# Patient Record
Sex: Male | Born: 1965 | Marital: Married
Health system: Southern US, Community
[De-identification: ages and names within clinical notes are randomized; demographics above are authoritative.]

## PROBLEM LIST (undated history)

## (undated) DIAGNOSIS — I2699 Other pulmonary embolism without acute cor pulmonale: Secondary | ICD-10-CM

## (undated) DIAGNOSIS — F329 Major depressive disorder, single episode, unspecified: Secondary | ICD-10-CM

## (undated) DIAGNOSIS — J449 Chronic obstructive pulmonary disease, unspecified: Secondary | ICD-10-CM

## (undated) DIAGNOSIS — F419 Anxiety disorder, unspecified: Secondary | ICD-10-CM

## (undated) DIAGNOSIS — M5126 Other intervertebral disc displacement, lumbar region: Secondary | ICD-10-CM

## (undated) DIAGNOSIS — K219 Gastro-esophageal reflux disease without esophagitis: Secondary | ICD-10-CM

## (undated) DIAGNOSIS — J45909 Unspecified asthma, uncomplicated: Secondary | ICD-10-CM

## (undated) DIAGNOSIS — F32A Depression, unspecified: Secondary | ICD-10-CM

## (undated) DIAGNOSIS — M797 Fibromyalgia: Secondary | ICD-10-CM

## (undated) DIAGNOSIS — I10 Essential (primary) hypertension: Secondary | ICD-10-CM

## (undated) DIAGNOSIS — E7800 Pure hypercholesterolemia, unspecified: Secondary | ICD-10-CM

## (undated) DIAGNOSIS — I82409 Acute embolism and thrombosis of unspecified deep veins of unspecified lower extremity: Secondary | ICD-10-CM

## (undated) DIAGNOSIS — Z8719 Personal history of other diseases of the digestive system: Secondary | ICD-10-CM

## (undated) HISTORY — DX: Other pulmonary embolism without acute cor pulmonale: I26.99

## (undated) HISTORY — DX: Gastro-esophageal reflux disease without esophagitis: K21.9

## (undated) HISTORY — DX: Unspecified asthma, uncomplicated: J45.909

---

## 2015-07-10 ENCOUNTER — Ambulatory Visit (HOSPITAL_BASED_OUTPATIENT_CLINIC_OR_DEPARTMENT_OTHER): Payer: Medicaid Other | Attending: Allergy and Immunology

## 2015-07-10 VITALS — Ht 75.0 in | Wt 260.0 lb

## 2015-07-10 DIAGNOSIS — G4733 Obstructive sleep apnea (adult) (pediatric): Secondary | ICD-10-CM | POA: Diagnosis present

## 2015-07-10 DIAGNOSIS — R0683 Snoring: Secondary | ICD-10-CM | POA: Insufficient documentation

## 2015-07-10 DIAGNOSIS — G4736 Sleep related hypoventilation in conditions classified elsewhere: Secondary | ICD-10-CM | POA: Diagnosis not present

## 2015-07-12 ENCOUNTER — Ambulatory Visit (HOSPITAL_BASED_OUTPATIENT_CLINIC_OR_DEPARTMENT_OTHER): Payer: Medicaid Other | Admitting: Internal Medicine

## 2015-07-12 DIAGNOSIS — G4733 Obstructive sleep apnea (adult) (pediatric): Secondary | ICD-10-CM | POA: Diagnosis not present

## 2015-07-12 NOTE — Progress Notes (Signed)
    Patient Name: Taylor Wise, Taylor Wise Date: 07/10/2015 Gender: Male D.O.B: 04-27-1966 Age (years): 49 Referring Provider: Laurette Schimke Height (inches): 75 Interpreting Physician: Jetty Duhamel MD, ABSM Weight (lbs): 262 RPSGT: Wylie Hail BMI: 33 MRN: [id number] Neck Size: 16.50 CLINICAL INFORMATION Sleep Study Type: NPSG  Indication for sleep study: COPD, OSA  Epworth Sleepiness Score:  SLEEP STUDY TECHNIQUE As per the AASM Manual for the Scoring of Sleep and Associated Events v2.3 (April 2016) with a hypopnea requiring 4% desaturations.  The channels recorded and monitored were frontal, central and occipital EEG, electrooculogram (EOG), submentalis EMG (chin), nasal and oral airflow, thoracic and abdominal wall motion, anterior tibialis EMG, snore microphone, electrocardiogram, and pulse oximetry.  MEDICATIONS Patient's medications include: Charted for review. Medications self-administered by patient during sleep study : No sleep medicine administered.  SLEEP ARCHITECTURE The study was initiated at 11:03:38 PM and ended at 5:06:41 AM.  Sleep onset time was 7.5 minutes and the sleep efficiency was 89.0%. The total sleep time was 323.1 minutes.  Stage REM latency was 121.5 minutes.  The patient spent 9.60% of the night in stage N1 sleep, 77.25% in stage N2 sleep, 0.00% in stage N3 and 13.16% in REM.  Alpha intrusion was absent.  Supine sleep was 71.71%.  Awake after sleep onset 32.5 minutes  RESPIRATORY PARAMETERS The overall apnea/hypopnea index (AHI) was 18.0 per hour. There were 1 total apneas, including 0 obstructive, 0 central and 1 mixed apneas. There were 96 hypopneas and 1 RERAs.  There were insufficient early events to meet protocol requirement for split CPAP titration b protocol on this study night.  The AHI during Stage REM sleep was 22.6 per hour.  AHI while supine was 21.8 per hour.  The mean oxygen saturation was 90.96%. The minimum SpO2 during sleep  was 82.00%.  Loud snoring was noted during this study.  CARDIAC DATA The 2 lead EKG demonstrated sinus rhythm. The mean heart rate was 61.16 beats per minute. Other EKG findings include: None.  LEG MOVEMENT DATA The total PLMS were 0 with a resulting PLMS index of 0.00. Associated arousal with leg movement index was 0.0 .  IMPRESSIONS Moderate obstructive sleep apnea occurred during this study (AHI = 18.0/h). There were insufficient early events to meet protocol requirement for split CPAP titration on this study night. No significant central sleep apnea occurred during this study (CAI = 0.0/h). Mild oxygen desaturation was noted during this study (Min O2 = 82.00%).   20.3 minutes recorded during sleep with room air O2 sat <= 88% The patient snored with Loud snoring volume. No cardiac abnormalities were noted during this study. Clinically significant periodic limb movements did not occur during sleep. No significant associated arousals.   DIAGNOSIS Obstructive Sleep Apnea (327.23 [G47.33 ICD-10]) Nocturnal Hypoxemia (327.26 [G47.36 ICD-10])  RECOMMENDATIONS Therapeutic CPAP titration to determine optimal pressure required to alleviate sleep disordered breathing. Positional therapy avoiding supine position during sleep. Avoid alcohol, sedatives and other CNS depressants that may worsen sleep apnea and disrupt normal sleep architecture. Sleep hygiene should be reviewed to assess factors that may improve sleep quality. Weight management and regular exercise should be initiated or continued if appropriate.  Waymon Budge Diplomate, American Board of Sleep Medicine  ELECTRONICALLY SIGNED ON:  07/12/2015, 1:34 PM [facility] PH: [phone]   FX: [phone] ACCREDITED BY THE AMERICAN ACADEMY OF SLEEP MEDICINE

## 2015-08-06 DIAGNOSIS — J309 Allergic rhinitis, unspecified: Secondary | ICD-10-CM

## 2015-08-06 DIAGNOSIS — J4550 Severe persistent asthma, uncomplicated: Secondary | ICD-10-CM | POA: Insufficient documentation

## 2015-08-06 DIAGNOSIS — I10 Essential (primary) hypertension: Secondary | ICD-10-CM | POA: Insufficient documentation

## 2015-08-06 DIAGNOSIS — K219 Gastro-esophageal reflux disease without esophagitis: Secondary | ICD-10-CM | POA: Insufficient documentation

## 2015-08-06 DIAGNOSIS — H1010 Acute atopic conjunctivitis, unspecified eye: Secondary | ICD-10-CM | POA: Insufficient documentation

## 2015-08-06 DIAGNOSIS — Z92241 Personal history of systemic steroid therapy: Secondary | ICD-10-CM | POA: Insufficient documentation

## 2015-08-14 ENCOUNTER — Ambulatory Visit (HOSPITAL_BASED_OUTPATIENT_CLINIC_OR_DEPARTMENT_OTHER): Payer: Medicaid Other | Attending: Allergy and Immunology

## 2015-08-14 ENCOUNTER — Other Ambulatory Visit: Payer: Self-pay

## 2015-08-14 DIAGNOSIS — G4730 Sleep apnea, unspecified: Secondary | ICD-10-CM | POA: Diagnosis present

## 2015-08-14 DIAGNOSIS — G4733 Obstructive sleep apnea (adult) (pediatric): Secondary | ICD-10-CM | POA: Diagnosis not present

## 2015-08-14 MED ORDER — MEPOLIZUMAB 100 MG ~~LOC~~ SOLR
100.0000 mg | SUBCUTANEOUS | Status: AC
  Administered 2015-09-09 – 2016-11-08 (×8): 100 mg via SUBCUTANEOUS

## 2015-08-16 ENCOUNTER — Encounter (HOSPITAL_BASED_OUTPATIENT_CLINIC_OR_DEPARTMENT_OTHER): Payer: Medicaid Other | Admitting: Internal Medicine

## 2015-08-16 DIAGNOSIS — G4730 Sleep apnea, unspecified: Secondary | ICD-10-CM | POA: Diagnosis not present

## 2015-08-16 NOTE — Progress Notes (Signed)
  Patient Name: Taylor Wise, Taylor Wise Date: 08/14/2015 Gender: Male D.O.B: [birth year] Age (years): 49 Referring Provider: Laurette Schimke Height (inches): 75 Interpreting Physician: Jetty Duhamel MD, ABSM Weight (lbs): 262 RPSGT: Cherylann Parr BMI: 33 MRN: [id number] Neck Size: 16.50 CLINICAL INFORMATION The patient is referred for a CPAP titration to treat sleep apnea.     Date of  diagnostic NPSG, Split Night or HST:   NPSG  07/10/15, AHI 18/ hr,  body weight 262 lbs  SLEEP STUDY TECHNIQUE As per the AASM Manual for the Scoring of Sleep and Associated Events v2.3 (April 2016) with a hypopnea requiring 4% desaturations.  The channels recorded and monitored were frontal, central and occipital EEG, electrooculogram (EOG), submentalis EMG (chin), nasal and oral airflow, thoracic and abdominal wall motion, anterior tibialis EMG, snore microphone, electrocardiogram, and pulse oximetry. Continuous positive airway pressure (CPAP) was initiated at the beginning of the study and titrated to treat sleep-disordered breathing.  MEDICATIONS Medications taken by the patient : charted for review Medications administered by patient during sleep study : No sleep medicine administered.  TECHNICIAN COMMENTS Comments added by technician: Patient talked in his/her sleep.  Comments added by scorer: N/A  RESPIRATORY PARAMETERS Optimal PAP Pressure (cm): 10 AHI at Optimal Pressure (/hr): 10.3 Overall Minimal O2 (%): 86.00 Supine % at Optimal Pressure (%): 100 Minimal O2 at Optimal Pressure (%): 89.0    SLEEP ARCHITECTURE The study was initiated at 9:58:20 PM and ended at 4:51:40 AM.  Sleep onset time was 2.2 minutes and the sleep efficiency was 93.1%. The total sleep time was 384.6 minutes.  The patient spent 3.77% of the night in stage N1 sleep, 52.78% in stage N2 sleep, 14.04% in stage N3 and 29.41% in REM.Stage REM latency was 143.0 minutes  Wake after sleep onset was 26.5. Alpha intrusion was  absent. Supine sleep was 81.89%.  CARDIAC DATA The 2 lead EKG demonstrated sinus rhythm. The mean heart rate was 66.19 beats per minute. Other EKG findings include: None.  LEG MOVEMENT DATA The total Periodic Limb Movements of Sleep (PLMS) were 391. The PLMS index was 60.99. A PLMS index of <15 is considered normal in adults.  IMPRESSIONS Titration stopped at 10 cwp, with residual AHI 10.3/ hr. Suggest initial home trial at 12 cwp, or auto-titrate 5-15 cwp. Central sleep apnea was not noted during this titration (CAI = 2.3/h). Moderate oxygen desaturations were observed during this titration (min O2 = 86.00%). No snoring was audible during this study. No cardiac abnormalities were observed during this study. Severe periodic limb movements were observed during this study. Arousals associated with PLMs were rare.  DIAGNOSIS Obstructive Sleep Apnea (327.23 [G47.33 ICD-10])  RECOMMENDATIONS Trial of CPAP therapy on 12 cm H2O, or auto-titrate 5-15 cwp with a Large size Fisher[employer]Paykel Full Face Mask Simplus mask and heated humidification. Avoid alcohol, sedatives and other CNS depressants that may worsen sleep apnea and disrupt normal sleep architecture. Sleep hygiene should be reviewed to assess factors that may improve sleep quality. Weight management and regular exercise should be initiated or continued.    Waymon Budge Diplomate, American Board of Sleep Medicine  ELECTRONICALLY SIGNED ON:  08/16/2015, 10:17 AM [facility] PH: [phone]   FX: [phone] ACCREDITED BY THE AMERICAN ACADEMY OF SLEEP MEDICINE

## 2015-09-09 ENCOUNTER — Ambulatory Visit (INDEPENDENT_AMBULATORY_CARE_PROVIDER_SITE_OTHER): Payer: Medicaid Other | Admitting: *Deleted

## 2015-09-09 DIAGNOSIS — J4550 Severe persistent asthma, uncomplicated: Secondary | ICD-10-CM | POA: Diagnosis not present

## 2015-10-01 ENCOUNTER — Telehealth: Payer: Self-pay | Admitting: Allergy and Immunology

## 2015-10-01 NOTE — Telephone Encounter (Signed)
Pt is still waiting on a referral to a pulmonologist. States this was suppose to be ordered back in August.

## 2015-10-01 NOTE — Telephone Encounter (Signed)
Left message for pt to call back  °

## 2015-10-01 NOTE — Telephone Encounter (Signed)
Spoke to [place] he is going to call his pcp and have them refer him.

## 2015-10-01 NOTE — Telephone Encounter (Signed)
Please apologize to patient as we have no record of referring him to a pulmonologist in his chart. It have been primary doctor that was to refer him. We can refer; would it be OK to drive to [place]? If so, [facility].

## 2015-10-01 NOTE — Telephone Encounter (Signed)
Please advise, no indications of referral in pts chart.

## 2015-10-08 ENCOUNTER — Ambulatory Visit (HOSPITAL_BASED_OUTPATIENT_CLINIC_OR_DEPARTMENT_OTHER): Payer: Medicaid Other

## 2015-10-14 ENCOUNTER — Ambulatory Visit (INDEPENDENT_AMBULATORY_CARE_PROVIDER_SITE_OTHER): Payer: Medicaid Other | Admitting: *Deleted

## 2015-10-14 DIAGNOSIS — J4550 Severe persistent asthma, uncomplicated: Secondary | ICD-10-CM

## 2015-10-16 ENCOUNTER — Ambulatory Visit (INDEPENDENT_AMBULATORY_CARE_PROVIDER_SITE_OTHER): Payer: Medicaid Other | Admitting: Internal Medicine

## 2015-10-16 ENCOUNTER — Encounter: Payer: Self-pay | Admitting: *Deleted

## 2015-10-16 VITALS — BP 142/88 | HR 84 | Ht 75.0 in | Wt 290.8 lb

## 2015-10-16 DIAGNOSIS — G4733 Obstructive sleep apnea (adult) (pediatric): Secondary | ICD-10-CM

## 2015-10-16 DIAGNOSIS — J45991 Cough variant asthma: Secondary | ICD-10-CM | POA: Diagnosis not present

## 2015-10-16 DIAGNOSIS — I272 Other secondary pulmonary hypertension: Secondary | ICD-10-CM

## 2015-10-16 DIAGNOSIS — J4550 Severe persistent asthma, uncomplicated: Secondary | ICD-10-CM

## 2015-10-16 NOTE — Progress Notes (Signed)
Subjective:     Patient ID: Taylor Wise, male   DOB: 22-Jul-1966,   MRN: [id number]  HPI  [age] yowm never smoker followed by Dr Lucie LeatherKozlow for asthma since 6th grade much better on symbicort / nucala but failed sleep study so referred to pulmonary clinic 10/16/2015 by Dr Jeanie Seweredding    10/16/2015 1st [place] [facility] office visit/ Kana Reimann   Chief Complaint  Patient presents with  . Pulmonary Consult    Referred by Dr. Gwendlyn DeutscherJohn Redding. Pt c/o long history of asthma, chronic bronchitis and multiple PE's. Pt has also had sleep study recently, with reported abnormal results. Pt c/o dizziness, SOB, wheeze and occasional cough. Pt has history of glass dust exposure for several years. Pt is on Nucala.   pt eval [facility] for recurrent  PE x 2 years and on coumadin ever since then with about 60 lb wt gain and short of breath mailbox and back and [place] stop [place] before goes up steps assoc with palpitations > eval by Dr Bing MatterKrasowski and told he had elevated R sided pressures and might need RHC Has cpap no 02 at present   No obvious day to day or daytime variability or assoc excess or purulent sputum  or cp or chest tightness,  or overt sinus or hb symptoms. No unusual exp hx or h/o childhood pna/ asthma or knowledge of premature birth.  Sleeping ok on cpap without nocturnal  or early am exacerbation  of respiratory  c/o's or need for noct saba. Also denies any obvious fluctuation of symptoms with weather or environmental changes or other aggravating or alleviating factors except as outlined above   Current Medications, Allergies, Complete Past Medical History, Past Surgical History, Family History, and Social History were reviewed in [facility] electronic [facility] record.  ROS  The following are not active complaints unless bolded sore throat, dysphagia, dental problems, itching, sneezing,  nasal congestion or excess/ purulent secretions, ear ache,   fever, chills, sweats, unintended wt loss, classically pleuritic  or exertional cp, hemoptysis,  orthopnea pnd or leg swelling, presyncope, palpitations, abdominal pain, anorexia, nausea, vomiting, diarrhea  or change in bowel or bladder habits, change in stools or urine, dysuria,hematuria,  rash, arthralgias, visual complaints, headache, numbness, weakness or ataxia or problems with walking or coordination,  change in mood/affect or memory.  Outpatient Encounter Prescriptions as of 10/16/2015  Medication Sig  . albuterol (PROVENTIL HFA) 108 (90 BASE) MCG/ACT inhaler Inhale 2 puffs into the lungs every 4 (four) hours as needed for wheezing or shortness of breath.  Marland Kitchen. aspirin 81 MG tablet Take 81 mg by mouth daily.  . budesonide-formoterol (SYMBICORT) 160-4.5 MCG/ACT inhaler Inhale 2 puffs into the lungs 2 (two) times daily.  . cetirizine (ZYRTEC) 10 MG tablet Take 10 mg by mouth daily as needed for allergies.  Marland Kitchen. EPINEPHrine (EPIPEN 2-PAK) 0.3 mg/0.3 mL IJ SOAJ injection Inject 0.3 mg into the muscle once.  . mometasone (NASONEX) 50 MCG/ACT nasal spray Place 2 sprays into the nose daily.  . Multiple Vitamin (MULTIVITAMIN) tablet Take 1 tablet by mouth daily.  Marland Kitchen. omeprazole (PRILOSEC) 40 MG capsule Take 40 mg by mouth daily.  . ranitidine (ZANTAC) 300 MG tablet Take 300 mg by mouth at bedtime.  . WARFARIN SODIUM PO Take by mouth.  . Venlafaxine HCl (EFFEXOR PO) Take by mouth.   Facility-Administered Encounter Medications as of 10/16/2015  Medication  . Mepolizumab SOLR 100 mg            Review of  Systems     Objective:   Physical Exam    amb mod obese wm nad   Wt Readings from Last 3 Encounters:  10/16/15 290 lb 12.8 oz (131.906 kg)  07/10/15 260 lb (117.935 kg)    Vital signs reviewed  HEENT: nl dentition, turbinates, and oropharynx. Nl external ear canals without cough reflex   NECK :  without JVD/Nodes/TM/ nl carotid upstrokes bilaterally   LUNGS: no acc muscle use, clear to A and P bilaterally without cough on insp or exp  maneuvers   CV:  RRR  no s3 or murmur - Pos  increase in P2, no edema   ABD:  soft and nontender with nl excursion in the supine position. No bruits or organomegaly, bowel sounds nl  MS:  warm without deformities, calf tenderness, cyanosis or clubbing  SKIN: warm and dry without lesions    NEURO:  alert, approp, no deficits            Assessment:

## 2015-10-16 NOTE — Patient Instructions (Signed)
Try symbicort 80 Take 2 puffs first thing in am and then another 2 puffs about 12 hours later.   If cough less on the symbicort 80 then refill it, otherwise resume the 160   Please see patient coordinator before you leave today  to schedule overnight oxygen study on CPAP and we will call with results  If your have pulmonary hypertension based on your cardiac evaluation I would recommend [facility] and will let him know to consider referring you as it is probably from your blood clots

## 2015-10-17 ENCOUNTER — Encounter: Payer: Self-pay | Admitting: Internal Medicine

## 2015-10-17 DIAGNOSIS — G4733 Obstructive sleep apnea (adult) (pediatric): Secondary | ICD-10-CM | POA: Insufficient documentation

## 2015-10-17 DIAGNOSIS — J45991 Cough variant asthma: Secondary | ICD-10-CM | POA: Insufficient documentation

## 2015-10-17 NOTE — Assessment & Plan Note (Signed)
cpap per Dr Jeanie Seweredding  - ono RA ordered 10/16/2015 to be done on CPAP  Due to suspected PH need to be sure sats ok on cpap s 02

## 2015-10-17 NOTE — Assessment & Plan Note (Addendum)
Complicated by osa   Body mass index is 36.35   No results found for: TSH   Contributing to gerd tendency/ doe/reviewed the need and the process to achieve and maintain neg calorie balance > defer f/u primary care including intermittently monitoring thyroid status

## 2015-10-17 NOTE — Assessment & Plan Note (Signed)
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   Offered to expedite referral once Dr Charm RingsK's cards eval is complete   Total time devoted to counseling  = 35/7675m review case with pt/ discussion of options/alternatives/ giving and going over instructions (see avs)

## 2015-10-17 NOTE — Assessment & Plan Note (Addendum)
-   spirometry May 27 2015 wnl   His exam is perfectly clear today and last pfts nl and yet he continues to cough in a manner that suggests a component of cough variant asthma or more likely   Upper airway cough syndrome, so named because it's frequently impossible to sort out how much is  CR/sinusitis with freq throat clearing (which can be related to primary GERD)   vs  causing  secondary (" extra esophageal")  GERD from wide swings in gastric pressure that occur with throat clearing, often  promoting self use of mint and menthol lozenges that reduce the lower esophageal sphincter tone and exacerbate the problem further in a cyclical fashion.   These are the same pts (now being labeled as having "irritable larynx syndrome" by some cough centers) who not infrequently have a history of having failed to tolerate ace inhibitors,  dry powder inhalers (and even sometimes high dose ics hfa) or biphosphonates or report having atypical reflux symptoms that don't respond to standard doses of PPI , and are easily confused as having aecopd or asthma flares by even experienced allergists/ pulmonologists (including me!)  The proper method of use, as well as anticipated side effects, of a metered-dose inhaler are discussed and demonstrated to the patient. Improved effectiveness after extensive coaching during this visit to a level of approximately  75% from a basline of 50% so rec trial of symbicort  80 2bid and if less cough would use this plus possibly qvar challenge at some point - added on if breathing/ wheezing worse on the 80 or maybe at some point replacing symbicort as maint rx using as step down if appropriate)but defer this to Dr Kathyrn LassKozlow's capable hands.

## 2015-10-22 ENCOUNTER — Other Ambulatory Visit: Payer: Self-pay | Admitting: Allergy and Immunology

## 2015-11-11 ENCOUNTER — Telehealth: Payer: Self-pay | Admitting: Internal Medicine

## 2015-11-11 ENCOUNTER — Encounter: Payer: Self-pay | Admitting: Internal Medicine

## 2015-11-11 NOTE — Telephone Encounter (Signed)
[facility] Per MW- results are OK and no changes needed  ATC and inform the pt  NA, and no option to leave a msg, [facility]

## 2015-11-13 NOTE — Telephone Encounter (Signed)
Pt notified of results  No questions voiced at this time Nothing further is needed

## 2015-11-17 ENCOUNTER — Telehealth: Payer: Self-pay | Admitting: *Deleted

## 2015-11-17 NOTE — Telephone Encounter (Signed)
Made pt appt 01/13 told to bring all meds pt aware

## 2015-11-17 NOTE — Telephone Encounter (Signed)
LMTCB

## 2015-11-17 NOTE — Telephone Encounter (Signed)
-----   Message from Nyoka CowdenMichael B Wert, MD sent at 11/17/2015  1:44 PM EST ----- Ov with all meds in hand for 4 weeks from now

## 2015-12-18 ENCOUNTER — Ambulatory Visit (INDEPENDENT_AMBULATORY_CARE_PROVIDER_SITE_OTHER): Payer: Medicaid Other | Admitting: Internal Medicine

## 2015-12-18 ENCOUNTER — Encounter: Payer: Self-pay | Admitting: Internal Medicine

## 2015-12-18 VITALS — BP 142/84 | HR 70 | Ht 75.0 in | Wt 283.0 lb

## 2015-12-18 DIAGNOSIS — J45991 Cough variant asthma: Secondary | ICD-10-CM | POA: Diagnosis not present

## 2015-12-18 DIAGNOSIS — Z86711 Personal history of pulmonary embolism: Secondary | ICD-10-CM | POA: Diagnosis not present

## 2015-12-18 DIAGNOSIS — G4733 Obstructive sleep apnea (adult) (pediatric): Secondary | ICD-10-CM | POA: Diagnosis not present

## 2015-12-18 NOTE — Progress Notes (Signed)
Subjective:     Patient ID: Taylor Wise, male   DOB: [birth year]    MRN: [id number]    Brief patient profile:  13 yowm never smoker followed by Dr Lucie Leather for asthma since 6th grade much better on symbicort / nucala but failed sleep study so referred to pulmonary clinic 10/16/2015 by Dr Jeanie Sewer    History of Present Illness  10/16/2015 1st [place] [facility] office visit/ Taylor Wise   Chief Complaint  Patient presents with  . Pulmonary Consult    Referred by Dr. Gwendlyn Deutscher. Pt c/o long history of asthma, chronic bronchitis and multiple PE's. Pt has also had sleep study recently, with reported abnormal results. Pt c/o dizziness, SOB, wheeze and occasional cough. Pt has history of glass dust exposure for several years. Pt is on Nucala.   pt eval [facility] for recurrent  PE x 2 years and on coumadin ever since then with about 60 lb wt gain and short of breath mailbox and back and [place] goes up steps assoc with palpitations > eval by Dr Bing Matter and told he had elevated R sided pressures and might need RHC Has cpap no 02 at present  rec Try symbicort 80 Take 2 puffs first thing in am and then another 2 puffs about 12 hours later.  If cough less on the symbicort 80 then refill it, otherwise resume the 160  Please see patient coordinator before you leave today  to schedule overnight oxygen study on CPAP and we will call with results    12/18/2015  f/u ov/Airlie Blumenberg re: ? PH/ severe obesity/ poorly controlled asthma  Chief Complaint  Patient presents with  . Follow-up    Pt states that his breathing is progressively worse since his last visit. He notices wheezing with exertion. He is using proair inhaler 3 x daily (every time he walks his dogs).     has not returned [facility]/ very poor insight into meds    No obvious day to day or daytime variability or assoc excess or purulent sputum  or cp or chest tightness,  or overt sinus or hb symptoms. No unusual exp hx or h/o  childhood pna/ asthma or knowledge of premature birth.  Sleeping ok on cpap without nocturnal  or early am exacerbation  of respiratory  c/o's or need for noct saba. Also denies any obvious fluctuation of symptoms with weather or environmental changes or other aggravating or alleviating factors except as outlined above   Current Medications, Allergies, Complete Past Medical History, Past Surgical History, Family History, and Social History were reviewed in [employer] record.  ROS  The following are not active complaints unless bolded sore throat, dysphagia, dental problems, itching, sneezing,  nasal congestion or excess/ purulent secretions, ear ache,   fever, chills, sweats, unintended wt loss, classically pleuritic or exertional cp, hemoptysis,  orthopnea pnd or leg swelling, presyncope, palpitations, abdominal pain, anorexia, nausea, vomiting, diarrhea  or change in bowel or bladder habits, change in stools or urine, dysuria,hematuria,  rash, arthralgias, visual complaints, headache, numbness, weakness or ataxia or problems with walking or coordination,  change in mood/affect or memory.     Objective:   Physical Exam    amb mod obese wm nad   Wt Readings from Last 3 Encounters:  12/18/15 283 lb (128.368 kg)  10/16/15 290 lb 12.8 oz (131.906 kg)  07/10/15 260 lb (117.935 kg)    Vital signs reviewed   HEENT: nl dentition, turbinates,  and oropharynx. Nl external ear canals without cough reflex   NECK :  without JVD/Nodes/TM/ nl carotid upstrokes bilaterally   LUNGS: no acc muscle use, clear to A and P bilaterally without cough on insp or exp maneuvers   CV:  RRR  no s3 or murmur - No def increase in P2, no edema   ABD:  soft and nontender with nl excursion in the supine position. No bruits or organomegaly, bowel sounds nl  MS:  warm without deformities, calf tenderness, cyanosis or clubbing  SKIN: warm and dry without lesions    NEURO:  alert, approp, no  deficits            Assessment:

## 2015-12-18 NOTE — Patient Instructions (Signed)
Work on inhaler technique:  relax and gently blow all the way out then take a nice smooth deep breath back in, triggering the inhaler at same time(or a slight delay with the spacer)  you start breathing in.  Hold for up to 5 seconds if you can. Blow out thru nose. Rinse and gargle with water when done  All follow up thru Dr Lucie LeatherKozlow   Pulmonary follow up is as needed

## 2015-12-19 ENCOUNTER — Encounter: Payer: Self-pay | Admitting: Internal Medicine

## 2015-12-19 DIAGNOSIS — Z86711 Personal history of pulmonary embolism: Secondary | ICD-10-CM | POA: Insufficient documentation

## 2015-12-19 NOTE — Assessment & Plan Note (Signed)
cpap per Dr Jeanie Seweredding  - download completed 09/22/15  cpap 12 > AHI 3.0 and usage 27 days > 4h per night = 90%  - ono RA 10/23/15    done on CPAP>  No desat  RA   Adequate control on present rx, reviewed > no change in rx needed  > f/u sleep medicine prn

## 2015-12-19 NOTE — Assessment & Plan Note (Addendum)
Body mass index is 35.37  - trending down slightly but a long way to go No results found for: TSH   Contributing to gerd tendency/ doe/reviewed the need and the process to achieve and maintain neg calorie balance > defer f/u primary care including intermittently monitoring thyroid status

## 2015-12-19 NOTE — Assessment & Plan Note (Signed)
-   spirometry May 27 2015 wnl  -10/16/2015  extensive coaching HFA effectiveness =    75% >  Trial of symbicort 80 instead of 160> worse so resumed the 160  DDX of  difficult airways management almost all start with A and  include Adherence, Ace Inhibitors, Acid Reflux, Active Sinus Disease, Alpha 1 Antitripsin deficiency, Anxiety masquerading as Airways dz,  ABPA,  Allergy(esp in young), Aspiration (esp in elderly), Adverse effects of meds,  Active smokers, A bunch of PE's (a small clot burden can't cause this syndrome unless there is already severe underlying pulm or vascular dz with poor reserve) plus two Bs  = Bronchiectasis and Beta blocker use..and one C= CHF  Adherence is always the initial "prime suspect" and is a multilayered concern that requires a "trust but verify" approach in every patient - starting with knowing how to use medications, especially inhalers, correctly, keeping up with refills and understanding the fundamental difference between maintenance and prns vs those medications only taken for a very short course and then stopped and not refilled.  - - The proper method of use, as well as anticipated side effects, of a metered-dose inhaler are discussed and demonstrated to the patient. Improved effectiveness after extensive coaching during this visit to a level of approximately 75 % from a baseline of 25 % so needs to keep working on this and either use the spacer consistently with all hfa or not at all, and understand the push and breath technique with the spacer vs the push and breath at the same time without it.   ? Acid (or non-acid) GERD > always difficult to exclude as up to 75% of pts in some series report no assoc GI/ Heartburn symptoms> rec max (24h)  acid suppression and diet restrictions/ reviewed and instructions given in writing.   ? Allergy > noncompliant with nucala but not sure it's helping, suggested he discuss with Dr Lucie LeatherKozlow  I had an extended discussion with the  patient reviewing all relevant studies completed to date and  lasting 15 to 20 minutes of a 25 minute visit    Each maintenance medication was reviewed in detail including most importantly the difference between maintenance and prns and under what circumstances the prns are to be triggered using an action plan format that is not reflected in the computer generated alphabetically organized AVS.    Please see instructions for details which were reviewed in writing and the patient given a copy highlighting the part that I personally wrote and discussed at today's ov.

## 2015-12-19 NOTE — Assessment & Plan Note (Addendum)
Echo Nov 21/2016 > nl with no RH abn's / rec continue coumadin indefinitely > Follow up per Primary Care planned

## 2016-03-09 ENCOUNTER — Ambulatory Visit (INDEPENDENT_AMBULATORY_CARE_PROVIDER_SITE_OTHER): Payer: Medicaid Other

## 2016-03-09 ENCOUNTER — Other Ambulatory Visit: Payer: Self-pay | Admitting: Allergy and Immunology

## 2016-03-09 DIAGNOSIS — J4550 Severe persistent asthma, uncomplicated: Secondary | ICD-10-CM

## 2016-04-08 ENCOUNTER — Other Ambulatory Visit: Payer: Self-pay | Admitting: Allergy and Immunology

## 2016-04-21 ENCOUNTER — Ambulatory Visit (INDEPENDENT_AMBULATORY_CARE_PROVIDER_SITE_OTHER): Payer: Medicaid Other | Admitting: *Deleted

## 2016-04-21 DIAGNOSIS — J4550 Severe persistent asthma, uncomplicated: Secondary | ICD-10-CM

## 2016-05-10 ENCOUNTER — Other Ambulatory Visit: Payer: Self-pay | Admitting: Allergy and Immunology

## 2016-06-29 ENCOUNTER — Ambulatory Visit (INDEPENDENT_AMBULATORY_CARE_PROVIDER_SITE_OTHER): Payer: Medicaid Other | Admitting: *Deleted

## 2016-06-29 DIAGNOSIS — J4550 Severe persistent asthma, uncomplicated: Secondary | ICD-10-CM

## 2016-07-11 ENCOUNTER — Other Ambulatory Visit: Payer: Self-pay | Admitting: Allergy and Immunology

## 2016-08-01 ENCOUNTER — Other Ambulatory Visit: Payer: Self-pay | Admitting: Allergy and Immunology

## 2016-08-10 ENCOUNTER — Other Ambulatory Visit: Payer: Self-pay

## 2016-08-10 ENCOUNTER — Ambulatory Visit (INDEPENDENT_AMBULATORY_CARE_PROVIDER_SITE_OTHER): Payer: Medicaid Other | Admitting: Allergy

## 2016-08-10 ENCOUNTER — Encounter: Payer: Self-pay | Admitting: Allergy

## 2016-08-10 VITALS — BP 128/100 | HR 64 | Resp 16 | Ht 74.02 in | Wt 265.4 lb

## 2016-08-10 DIAGNOSIS — J4550 Severe persistent asthma, uncomplicated: Secondary | ICD-10-CM | POA: Diagnosis not present

## 2016-08-10 DIAGNOSIS — H1010 Acute atopic conjunctivitis, unspecified eye: Secondary | ICD-10-CM | POA: Diagnosis not present

## 2016-08-10 DIAGNOSIS — K219 Gastro-esophageal reflux disease without esophagitis: Secondary | ICD-10-CM

## 2016-08-10 DIAGNOSIS — J309 Allergic rhinitis, unspecified: Secondary | ICD-10-CM

## 2016-08-10 DIAGNOSIS — G4730 Sleep apnea, unspecified: Secondary | ICD-10-CM

## 2016-08-10 MED ORDER — CETIRIZINE HCL 10 MG PO TABS: 10.0000 mg | ORAL_TABLET | Freq: Every day | ORAL | 5 refills | Status: AC | PRN

## 2016-08-10 NOTE — Progress Notes (Signed)
Follow-up Note  RE: Taylor Wise MRN: [id number] DOB: [birth year] Date of Office Visit: 08/10/2016   History of present illness: Taylor Wise is a 50 y.o. male presenting today for follow-up of asthma, allergic rhinoconjunctivitis and reflux. He was last seen in our office in July 2016 by Dr. Lucie Leather. Since that time he was started on Nucala for his severe asthma however he reports having issues over the winter where he could not make it to get his injections. He restarted on Nucala in April.  Injections are going well without issue. He's dates he does feel improvements with his asthma. He did go camping over the past month and report since then has had a lot of nasal drainage and congestion and a lot of sinus pressure. He states the symptoms have improved. He was using Zyrtec but he ran out. He continues to take his Singulair and uses Nasonex 1 spray daily.  Since last visit he was treated with steroids for an exacerbation 1-2 times (last fall and this spring) and reports the last course was about 2 months ago.  He denies any hospitalizations. He continues to take his Symbicort2 puffs twice a day and uses Albuterol use about once a week.    He continues to follow with Dr. Sharee Pimple and pulmonary for sleep apnea. He is now using CPAP at night and he reports he does not wake up at night anymore.  He feels more rested during the day.  For his reflux he continues to take omeprazole and ranitidine.   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  He also continues to follow with his cardiologist and was changed from hydrochlorothiazide to another antihypertensive which she does not recall for blood pressure control.     Review of systems: Review of Systems  Constitutional: Negative for fever.  HENT: Positive for congestion. Negative for sore throat.   Eyes: Negative for redness.  Respiratory: Positive for cough and  shortness of breath. Negative for wheezing.   Cardiovascular: Negative for chest pain.  Gastrointestinal: Negative for nausea and vomiting.  Musculoskeletal: Positive for back pain.  Skin: Negative for rash.  Neurological: Positive for headaches.    All other systems negative unless noted above in HPI  Past medical/social/surgical/family history have been reviewed and are unchanged unless specifically indicated below.  No changes  Medication List:   Medication List       Accurate as of 08/10/16 12:13 PM. Always use your most recent med list.          aspirin 81 MG tablet Take 81 mg by mouth daily.   cetirizine 10 MG tablet Commonly known as:  ZYRTEC Take 1 tablet (10 mg total) by mouth daily as needed for allergies.   EPIPEN 2-PAK 0.3 mg/0.3 mL Soaj injection Generic drug:  EPINEPHrine Inject 0.3 mg into the muscle once.   hydrochlorothiazide 12.5 MG capsule Commonly known as:  MICROZIDE Take 12.5 mg by mouth daily.   montelukast 10 MG tablet Commonly known as:  SINGULAIR Take 10 mg by mouth at bedtime.   multivitamin tablet Take 1 tablet by mouth daily.   NASONEX 50 MCG/ACT nasal spray Generic drug:  mometasone USE 1 SPRAY IN EACH NOSTRIL ONCE DAILY FOR STUFFY NOSE OR DRAINAGE   omeprazole 20 MG capsule Commonly known as:  PRILOSEC Take 20 mg by mouth daily.   PROAIR HFA 108 (90 Base)  MCG/ACT inhaler Generic drug:  albuterol Inhale 2 puffs into the lungs every 6 (six) hours as needed for wheezing or shortness of breath.   ranitidine 300 MG tablet Commonly known as:  ZANTAC TAKE 1 TABLET EVERY EVENING FOR ACID REFLUX   SYMBICORT 160-4.5 MCG/ACT inhaler Generic drug:  budesonide-formoterol USE 2 PUFFS EVERY 12 HOURS. TO PREVENT COUGH AND WHEEZING. (RINSE, GARGLE AND SPIT AFTER USE)   venlafaxine XR 150 MG 24 hr capsule Commonly known as:  EFFEXOR-XR Take 150 mg by mouth daily with breakfast.   venlafaxine 75 MG tablet Commonly known as:   EFFEXOR Take 75 mg by mouth daily.   warfarin 3 MG tablet Commonly known as:  COUMADIN Take 3 mg by mouth as directed.       Known medication allergies: No Known Allergies   Physical examination: Blood pressure (!) 128/100, pulse 64, resp. rate 16, height 6' 2.02" (1.88 m), weight 265 lb 6.4 oz (120.4 kg).  General: Alert, interactive, in no acute distress. HEENT: TMs pearly gray, turbinates moderately edematous with clear discharge, post-pharynx non erythematous. Neck: Supple without lymphadenopathy. Lungs: Clear to auscultation without wheezing, rhonchi or rales. {no increased work of breathing. CV: Normal S1, S2 without murmurs. Abdomen: Nondistended, nontender. Skin: Warm and dry, without lesions or rashes. Extremities:  No clubbing, cyanosis or edema. Neuro:   Grossly intact.  Diagnositics/Labs:  Spirometry: FEV1: 4.43L  101%, FVC: 5.7L  100%, ratio consistent with Nonobstructive pattern study is normal today  Assessment and plan:   Asthma, severe persistent  - continue Nucala injections. He has an EpiPen.  - continue Symbicort 160 2 puffs twice a day  - conitnue singulair 10mg  daily  - continue albuterol as needed Asthma control goals:   Full participation in all desired activities (may need albuterol before activity)  Albuterol use two time or less a week on average (not counting use with activity)  Cough interfering with sleep two time or less a month  Oral steroids no more than once a year  No hospitalizations   Allergic rhinoconjunctivitis  - continue Nasonex 2 sprays each nostril daily  - advise use of nasal saline rinse daily prior to Nasonex use and provided with saline rinse bottle sample today  - continue antihistamine daily  Reflux  - continue Omeprazole 40 mg and Ranitidine 300 mg daily  Obstructive sleep apnea - Continue CPAP use at night and follow up with Dr. Sherene SiresWert  Follow-up 4 months   I appreciate the opportunity to take part in  Taylor Wise's care. Please do not hesitate to contact me with questions.  Sincerely,   Margo AyeShaylar Rylei Codispoti, MD Allergy/Immunology [facility]

## 2016-08-10 NOTE — Patient Instructions (Addendum)
Asthma, severe persistent  - continue Nucala injections  - continue Symbicort 160 2 puffs twice a day  - conitnue singulair 10mg  daily  - continue albuterol as needed  Allergic rhinoconjunctivitis  - continue Nasonex 2 sprays each nostril daily  - advise use of nasal saline rinse daily prior to Nasonex use  - ocntinue antihistamine daily  Reflux  - continue Omeprazole and Ranitidine daily  Follow-up 4 months

## 2016-10-03 ENCOUNTER — Other Ambulatory Visit: Payer: Self-pay | Admitting: Allergy and Immunology

## 2016-10-06 ENCOUNTER — Ambulatory Visit (INDEPENDENT_AMBULATORY_CARE_PROVIDER_SITE_OTHER): Payer: Medicaid Other | Admitting: *Deleted

## 2016-10-06 DIAGNOSIS — J4550 Severe persistent asthma, uncomplicated: Secondary | ICD-10-CM | POA: Diagnosis not present

## 2016-10-13 ENCOUNTER — Other Ambulatory Visit: Payer: Self-pay | Admitting: Allergy and Immunology

## 2016-11-07 ENCOUNTER — Telehealth: Payer: Self-pay | Admitting: *Deleted

## 2016-11-07 NOTE — Telephone Encounter (Signed)
Per Dr Lucie LeatherKozlow contacted patient in regards to note from PCP Dr Jeanie Seweredding that patient still uncontrolled asthma on Nucala and [facility] Appt made for 11/14/16

## 2016-11-08 ENCOUNTER — Ambulatory Visit (INDEPENDENT_AMBULATORY_CARE_PROVIDER_SITE_OTHER): Payer: Medicaid Other | Admitting: *Deleted

## 2016-11-08 DIAGNOSIS — J4550 Severe persistent asthma, uncomplicated: Secondary | ICD-10-CM

## 2016-11-14 ENCOUNTER — Ambulatory Visit (INDEPENDENT_AMBULATORY_CARE_PROVIDER_SITE_OTHER): Payer: Medicaid Other | Admitting: Allergy and Immunology

## 2016-11-14 ENCOUNTER — Encounter: Payer: Self-pay | Admitting: Allergy and Immunology

## 2016-11-14 VITALS — BP 130/100 | HR 88 | Resp 24

## 2016-11-14 DIAGNOSIS — J3089 Other allergic rhinitis: Secondary | ICD-10-CM

## 2016-11-14 DIAGNOSIS — K219 Gastro-esophageal reflux disease without esophagitis: Secondary | ICD-10-CM

## 2016-11-14 DIAGNOSIS — J4550 Severe persistent asthma, uncomplicated: Secondary | ICD-10-CM | POA: Diagnosis not present

## 2016-11-14 MED ORDER — ALBUTEROL SULFATE HFA 108 (90 BASE) MCG/ACT IN AERS: INHALATION_SPRAY | RESPIRATORY_TRACT | 1 refills | Status: AC

## 2016-11-14 NOTE — Patient Instructions (Addendum)
  1. Continue nucala injections  2. Continue Symbicort 160 - 2 inhalations twice a day  3. Continue Nasonex one spray each nostril twice a day  4. Continue montelukast 10 mg tablet 1 time per day  5. Continue combination of omeprazole 20 mg in AM and ranitidine 300 mg in PM  6. Continue albuterol MDI and cetirizine if needed  7. Obtain fall flu vaccine  8. Evaluation with GI doctor for reflux  9. Return to clinic in 3 months or earlier if problem

## 2016-11-14 NOTE — Progress Notes (Signed)
Follow-up Note  Referring Provider: Noni Saupeedding, John F. II, MD Primary Provider: Noni SaupeEDDING II,JOHN F., MD Date of Office Visit: 11/14/2016  Subjective:   Taylor Wise (DOB: 03-20-1966) is a 50 y.o. male who [facility] in re-evaluation of the following:  HPI: Taylor Wise returns to this clinic in reevaluation of his asthma treated with nucala, allergic rhinoconjunctivitis, and reflux. I last saw him in this clinic over a year ago.  Overall he's done relatively well regarding his asthma as long as he remains on nucala and Symbicort. He does use a short acting bronchodilator around the time of exercise but rarely outside of exercise. His nose has been doing okay while using some Nasonex. He has not required an antibiotic to treat an episode of sinusitis although he has had the administration of systemic steroid at least once in the past year for an asthma exacerbation.  His reflux is active even in the face of using his proton pump inhibitor and H2 receptor blocker. He still drinks 4 coffees per day.    Medication List      ALKA-SELTZER PLUS COLD PO Take by mouth.   amLODipine 5 MG tablet Commonly known as:  NORVASC Take 5 mg by mouth daily.   aspirin 325 MG tablet Take 325 mg by mouth daily.   cetirizine 10 MG tablet Commonly known as:  ZYRTEC Take 1 tablet (10 mg total) by mouth daily as needed for allergies.   DULoxetine 60 MG capsule Commonly known as:  CYMBALTA Take 60 mg by mouth daily.   EPIPEN 2-PAK 0.3 mg/0.3 mL Soaj injection Generic drug:  EPINEPHrine Inject 0.3 mg into the muscle once.   montelukast 10 MG tablet Commonly known as:  SINGULAIR Take 10 mg by mouth at bedtime.   multivitamin tablet Take 1 tablet by mouth daily.   NASONEX 50 MCG/ACT nasal spray Generic drug:  mometasone USE 1 SPRAY IN EACH NOSTRIL ONCE DAILY FOR STUFFY NOSE OR DRAINAGE   omeprazole 20 MG capsule Commonly known as:  PRILOSEC Take 20 mg  by mouth daily.   pravastatin 20 MG tablet Commonly known as:  PRAVACHOL Take 20 mg by mouth daily.   PROAIR HFA 108 (90 Base) MCG/ACT inhaler Generic drug:  albuterol Inhale 2 puffs into the lungs every 6 (six) hours as needed for wheezing or shortness of breath.   ranitidine 300 MG tablet Commonly known as:  ZANTAC TAKE 1 TABLET EVERY EVENING FOR ACID REFLUX   SYMBICORT 160-4.5 MCG/ACT inhaler Generic drug:  budesonide-formoterol INHALE 2 PUFFS EVERY 12 HOURS TO PREVENTCOUGH AND WHEEZING. RINSE, GARGLE, AND SPIT AFTER EACH USE   warfarin 3 MG tablet Commonly known as:  COUMADIN Take 3 mg by mouth as directed.       Past Medical History:  Diagnosis Date  . GERD (gastroesophageal reflux disease)   . Pulmonary embolus (HCC)   . Severe asthma     History reviewed. No pertinent surgical history.  No Known Allergies  Review of systems negative except as noted in HPI / PMHx or noted below:  Review of Systems  Constitutional: Negative.   HENT: Negative.   Eyes: Negative.   Respiratory: Negative.   Cardiovascular: Negative.   Gastrointestinal: Negative.   Genitourinary: Negative.   Musculoskeletal: Negative.   Skin: Negative.   Neurological: Negative.   Endo/Heme/Allergies: Negative.   Psychiatric/Behavioral: Negative.      Objective:   Vitals:   11/14/16 1524  BP: (!) 130/100  Pulse: 88  Resp: (!) 24          Physical Exam  Constitutional: He is well-developed, well-nourished, and in no distress.  HENT:  Head: Normocephalic.  Right Ear: Tympanic membrane, external ear and ear canal normal.  Left Ear: Tympanic membrane, external ear and ear canal normal.  Nose: Nose normal. No mucosal edema or rhinorrhea.  Mouth/Throat: Uvula is midline, oropharynx is clear and moist and mucous membranes are normal. No oropharyngeal exudate.  Eyes: Conjunctivae are normal.  Neck: Trachea normal. No tracheal tenderness present. No tracheal deviation present. No  thyromegaly present.  Cardiovascular: Normal rate, regular rhythm, S1 normal, S2 normal and normal heart sounds.   No murmur heard. Pulmonary/Chest: Breath sounds normal. No stridor. No respiratory distress. He has no wheezes. He has no rales.  Musculoskeletal: He exhibits no edema.  Lymphadenopathy:       Head (right side): No tonsillar adenopathy present.       Head (left side): No tonsillar adenopathy present.    He has no cervical adenopathy.  Neurological: He is alert. Gait normal.  Skin: No rash noted. He is not diaphoretic. No erythema. Nails show no clubbing.  Psychiatric: Mood and affect normal.    Diagnostics:    Spirometry was performed and demonstrated an FEV1 of 4.62 at 105 % of predicted.  The patient had an Asthma Control Test with the following results: ACT Total Score: 13.    Assessment and Plan:   1. Severe persistent asthma, uncomplicated   2. Other allergic rhinitis   3. Gastroesophageal reflux disease, esophagitis presence not specified     1. Continue Nucala injections  2. Continue Symbicort 160 - 2 inhalations twice a day  3. Continue Nasonex one spray each nostril twice a day  4. Continue montelukast 10 mg tablet 1 time per day  5. Continue combination of omeprazole 20 mg in AM and ranitidine 300 mg in PM  6. Continue albuterol MDI and cetirizine if needed  7. Obtain fall flu vaccine  8. Evaluation with GI doctor for reflux  9. Return to clinic in 3 months or earlier if problem  Overall Taylor Wise appears to be doing relatively well on his current medical therapy and we will continue to have him use nucala as well as anti-inflammatory agents for his respiratory tract and aggressive therapy directed against reflux. His reflux is still active and I think it would be worthwhile to have him evaluated by a GI doctor in investigation of this problem. We'll see we can get that arranged sometime in the next month or so. I'll see him back in this clinic in 3  months or earlier if there is a problem.  Laurette SchimkeEric Kozlow, MD [facility]

## 2016-11-17 ENCOUNTER — Telehealth: Payer: Self-pay | Admitting: *Deleted

## 2016-11-17 NOTE — Telephone Encounter (Signed)
[facility] responded to Dr. Kathyrn LassKozlow's referral request for [name] to see Dr. Chales AbrahamsGupta. Patient aware of appointment date and time.  December 27, 2016 @ 12:00

## 2016-11-22 ENCOUNTER — Other Ambulatory Visit: Payer: Self-pay | Admitting: *Deleted

## 2016-11-22 MED ORDER — FLUTICASONE PROPIONATE 50 MCG/ACT NA SUSP: 2.0000 | Freq: Every day | NASAL | 3 refills | Status: AC

## 2016-12-04 ENCOUNTER — Emergency Department (HOSPITAL_COMMUNITY): Payer: Medicaid Other

## 2016-12-04 ENCOUNTER — Inpatient Hospital Stay (HOSPITAL_COMMUNITY)
Admission: EM | Admit: 2016-12-04 | Discharge: 2017-01-05 | DRG: 492 | Disposition: E | Payer: Medicaid Other | Attending: General Surgery | Admitting: General Surgery

## 2016-12-04 ENCOUNTER — Encounter (HOSPITAL_COMMUNITY): Payer: Self-pay | Admitting: Emergency Medicine

## 2016-12-04 ENCOUNTER — Inpatient Hospital Stay (HOSPITAL_COMMUNITY): Payer: Medicaid Other

## 2016-12-04 DIAGNOSIS — T148XXA Other injury of unspecified body region, initial encounter: Secondary | ICD-10-CM

## 2016-12-04 DIAGNOSIS — Q899 Congenital malformation, unspecified: Secondary | ICD-10-CM

## 2016-12-04 DIAGNOSIS — R739 Hyperglycemia, unspecified: Secondary | ICD-10-CM | POA: Diagnosis not present

## 2016-12-04 DIAGNOSIS — R402252 Coma scale, best verbal response, oriented, at arrival to emergency department: Secondary | ICD-10-CM | POA: Diagnosis present

## 2016-12-04 DIAGNOSIS — J95821 Acute postprocedural respiratory failure: Secondary | ICD-10-CM

## 2016-12-04 DIAGNOSIS — S225XXA Flail chest, initial encounter for closed fracture: Secondary | ICD-10-CM | POA: Diagnosis not present

## 2016-12-04 DIAGNOSIS — S2221XA Fracture of manubrium, initial encounter for closed fracture: Secondary | ICD-10-CM | POA: Diagnosis present

## 2016-12-04 DIAGNOSIS — D62 Acute posthemorrhagic anemia: Secondary | ICD-10-CM | POA: Diagnosis not present

## 2016-12-04 DIAGNOSIS — S2243XA Multiple fractures of ribs, bilateral, initial encounter for closed fracture: Secondary | ICD-10-CM | POA: Diagnosis present

## 2016-12-04 DIAGNOSIS — E872 Acidosis: Secondary | ICD-10-CM | POA: Diagnosis not present

## 2016-12-04 DIAGNOSIS — S82141A Displaced bicondylar fracture of right tibia, initial encounter for closed fracture: Secondary | ICD-10-CM | POA: Diagnosis present

## 2016-12-04 DIAGNOSIS — J80 Acute respiratory distress syndrome: Secondary | ICD-10-CM

## 2016-12-04 DIAGNOSIS — S22069A Unspecified fracture of T7-T8 vertebra, initial encounter for closed fracture: Secondary | ICD-10-CM | POA: Diagnosis present

## 2016-12-04 DIAGNOSIS — N179 Acute kidney failure, unspecified: Secondary | ICD-10-CM | POA: Diagnosis not present

## 2016-12-04 DIAGNOSIS — J440 Chronic obstructive pulmonary disease with acute lower respiratory infection: Secondary | ICD-10-CM | POA: Diagnosis not present

## 2016-12-04 DIAGNOSIS — J942 Hemothorax: Secondary | ICD-10-CM

## 2016-12-04 DIAGNOSIS — Y92410 Unspecified street and highway as the place of occurrence of the external cause: Secondary | ICD-10-CM | POA: Diagnosis not present

## 2016-12-04 DIAGNOSIS — R58 Hemorrhage, not elsewhere classified: Secondary | ICD-10-CM

## 2016-12-04 DIAGNOSIS — I10 Essential (primary) hypertension: Secondary | ICD-10-CM | POA: Diagnosis present

## 2016-12-04 DIAGNOSIS — R0902 Hypoxemia: Secondary | ICD-10-CM

## 2016-12-04 DIAGNOSIS — Y95 Nosocomial condition: Secondary | ICD-10-CM | POA: Diagnosis not present

## 2016-12-04 DIAGNOSIS — J939 Pneumothorax, unspecified: Secondary | ICD-10-CM

## 2016-12-04 DIAGNOSIS — J189 Pneumonia, unspecified organism: Secondary | ICD-10-CM | POA: Diagnosis not present

## 2016-12-04 DIAGNOSIS — F1027 Alcohol dependence with alcohol-induced persisting dementia: Secondary | ICD-10-CM | POA: Diagnosis present

## 2016-12-04 DIAGNOSIS — S0181XA Laceration without foreign body of other part of head, initial encounter: Secondary | ICD-10-CM | POA: Diagnosis present

## 2016-12-04 DIAGNOSIS — S8254XA Nondisplaced fracture of medial malleolus of right tibia, initial encounter for closed fracture: Secondary | ICD-10-CM | POA: Diagnosis present

## 2016-12-04 DIAGNOSIS — S022XXA Fracture of nasal bones, initial encounter for closed fracture: Secondary | ICD-10-CM | POA: Diagnosis present

## 2016-12-04 DIAGNOSIS — Y906 Blood alcohol level of 120-199 mg/100 ml: Secondary | ICD-10-CM | POA: Diagnosis present

## 2016-12-04 DIAGNOSIS — I739 Peripheral vascular disease, unspecified: Secondary | ICD-10-CM | POA: Diagnosis present

## 2016-12-04 DIAGNOSIS — I471 Supraventricular tachycardia: Secondary | ICD-10-CM | POA: Diagnosis not present

## 2016-12-04 DIAGNOSIS — Z0189 Encounter for other specified special examinations: Secondary | ICD-10-CM

## 2016-12-04 DIAGNOSIS — S270XXA Traumatic pneumothorax, initial encounter: Secondary | ICD-10-CM | POA: Diagnosis not present

## 2016-12-04 DIAGNOSIS — Z86718 Personal history of other venous thrombosis and embolism: Secondary | ICD-10-CM

## 2016-12-04 DIAGNOSIS — S1192XA Laceration with foreign body of unspecified part of neck, initial encounter: Secondary | ICD-10-CM | POA: Diagnosis present

## 2016-12-04 DIAGNOSIS — S82251A Displaced comminuted fracture of shaft of right tibia, initial encounter for closed fracture: Secondary | ICD-10-CM | POA: Diagnosis present

## 2016-12-04 DIAGNOSIS — F10229 Alcohol dependence with intoxication, unspecified: Secondary | ICD-10-CM | POA: Diagnosis present

## 2016-12-04 DIAGNOSIS — T4275XA Adverse effect of unspecified antiepileptic and sedative-hypnotic drugs, initial encounter: Secondary | ICD-10-CM | POA: Diagnosis not present

## 2016-12-04 DIAGNOSIS — Z419 Encounter for procedure for purposes other than remedying health state, unspecified: Secondary | ICD-10-CM

## 2016-12-04 DIAGNOSIS — S82831A Other fracture of upper and lower end of right fibula, initial encounter for closed fracture: Secondary | ICD-10-CM | POA: Diagnosis present

## 2016-12-04 DIAGNOSIS — S12400A Unspecified displaced fracture of fifth cervical vertebra, initial encounter for closed fracture: Secondary | ICD-10-CM | POA: Diagnosis present

## 2016-12-04 DIAGNOSIS — Z86711 Personal history of pulmonary embolism: Secondary | ICD-10-CM

## 2016-12-04 DIAGNOSIS — I468 Cardiac arrest due to other underlying condition: Secondary | ICD-10-CM | POA: Diagnosis not present

## 2016-12-04 DIAGNOSIS — S82201A Unspecified fracture of shaft of right tibia, initial encounter for closed fracture: Secondary | ICD-10-CM | POA: Diagnosis present

## 2016-12-04 DIAGNOSIS — R579 Shock, unspecified: Secondary | ICD-10-CM | POA: Diagnosis not present

## 2016-12-04 DIAGNOSIS — S82451A Displaced comminuted fracture of shaft of right fibula, initial encounter for closed fracture: Secondary | ICD-10-CM | POA: Diagnosis present

## 2016-12-04 DIAGNOSIS — S82309A Unspecified fracture of lower end of unspecified tibia, initial encounter for closed fracture: Secondary | ICD-10-CM

## 2016-12-04 DIAGNOSIS — J9601 Acute respiratory failure with hypoxia: Secondary | ICD-10-CM | POA: Diagnosis not present

## 2016-12-04 DIAGNOSIS — T791XXA Fat embolism (traumatic), initial encounter: Secondary | ICD-10-CM | POA: Diagnosis not present

## 2016-12-04 DIAGNOSIS — R402362 Coma scale, best motor response, obeys commands, at arrival to emergency department: Secondary | ICD-10-CM | POA: Diagnosis present

## 2016-12-04 DIAGNOSIS — R402132 Coma scale, eyes open, to sound, at arrival to emergency department: Secondary | ICD-10-CM | POA: Diagnosis present

## 2016-12-04 DIAGNOSIS — Z781 Physical restraint status: Secondary | ICD-10-CM

## 2016-12-04 DIAGNOSIS — Z7901 Long term (current) use of anticoagulants: Secondary | ICD-10-CM

## 2016-12-04 DIAGNOSIS — S42112A Displaced fracture of body of scapula, left shoulder, initial encounter for closed fracture: Secondary | ICD-10-CM | POA: Diagnosis present

## 2016-12-04 DIAGNOSIS — J9690 Respiratory failure, unspecified, unspecified whether with hypoxia or hypercapnia: Secondary | ICD-10-CM

## 2016-12-04 DIAGNOSIS — T17990A Other foreign object in respiratory tract, part unspecified in causing asphyxiation, initial encounter: Secondary | ICD-10-CM | POA: Diagnosis not present

## 2016-12-04 HISTORY — DX: Anxiety disorder, unspecified: F41.9

## 2016-12-04 HISTORY — DX: Other pulmonary embolism without acute cor pulmonale: I26.99

## 2016-12-04 HISTORY — DX: Essential (primary) hypertension: I10

## 2016-12-04 HISTORY — DX: Other intervertebral disc displacement, lumbar region: M51.26

## 2016-12-04 HISTORY — DX: Fibromyalgia: M79.7

## 2016-12-04 HISTORY — DX: Unspecified asthma, uncomplicated: J45.909

## 2016-12-04 HISTORY — DX: Depression, unspecified: F32.A

## 2016-12-04 HISTORY — DX: Major depressive disorder, single episode, unspecified: F32.9

## 2016-12-04 HISTORY — DX: Acute embolism and thrombosis of unspecified deep veins of unspecified lower extremity: I82.409

## 2016-12-04 HISTORY — DX: Chronic obstructive pulmonary disease, unspecified: J44.9

## 2016-12-04 HISTORY — DX: Personal history of other diseases of the digestive system: Z87.19

## 2016-12-04 HISTORY — DX: Pure hypercholesterolemia, unspecified: E78.00

## 2016-12-04 LAB — COMPREHENSIVE METABOLIC PANEL
ALT: 51 U/L (ref 17–63)
AST: 70 U/L — AB (ref 15–41)
Albumin: 3.3 g/dL — ABNORMAL LOW (ref 3.5–5.0)
Alkaline Phosphatase: 55 U/L (ref 38–126)
Anion gap: 12 (ref 5–15)
BUN: 17 mg/dL (ref 6–20)
CHLORIDE: 112 mmol/L — AB (ref 101–111)
CO2: 15 mmol/L — AB (ref 22–32)
CREATININE: 0.89 mg/dL (ref 0.61–1.24)
Calcium: 8.2 mg/dL — ABNORMAL LOW (ref 8.9–10.3)
GFR calc Af Amer: >60 mL/min (ref >60)
GFR calc non Af Amer: >60 mL/min (ref >60)
Glucose, Bld: 132 mg/dL — ABNORMAL HIGH (ref 65–99)
Potassium: 3.9 mmol/L (ref 3.5–5.1)
SODIUM: 139 mmol/L (ref 135–145)
Total Bilirubin: 0.7 mg/dL (ref 0.3–1.2)
Total Protein: 6 g/dL — ABNORMAL LOW (ref 6.5–8.1)

## 2016-12-04 LAB — I-STAT CHEM 8, ED
BUN: 21 mg/dL — ABNORMAL HIGH (ref 6–20)
Calcium, Ion: 0.96 mmol/L — ABNORMAL LOW (ref 1.15–1.40)
Chloride: 110 mmol/L (ref 101–111)
Creatinine, Ser: 1.1 mg/dL (ref 0.61–1.24)
Glucose, Bld: 133 mg/dL — ABNORMAL HIGH (ref 65–99)
HEMATOCRIT: 37 % — AB (ref 39.0–52.0)
HEMOGLOBIN: 12.6 g/dL — AB (ref 13.0–17.0)
POTASSIUM: 3.8 mmol/L (ref 3.5–5.1)
Sodium: 140 mmol/L (ref 135–145)
TCO2: 16 mmol/L (ref 0–100)

## 2016-12-04 LAB — URINALYSIS, ROUTINE W REFLEX MICROSCOPIC
Bilirubin Urine: NEGATIVE
GLUCOSE, UA: NEGATIVE mg/dL
KETONES UR: NEGATIVE mg/dL
LEUKOCYTES UA: NEGATIVE
Nitrite: NEGATIVE
PH: 5.5 (ref 5.0–8.0)
Protein, ur: NEGATIVE mg/dL
SPECIFIC GRAVITY, URINE: 1.01 (ref 1.005–1.030)

## 2016-12-04 LAB — GLUCOSE, CAPILLARY: GLUCOSE-CAPILLARY: 137 mg/dL — AB (ref 65–99)

## 2016-12-04 LAB — CBC
HCT: 37.1 % — ABNORMAL LOW (ref 39.0–52.0)
Hemoglobin: 12.7 g/dL — ABNORMAL LOW (ref 13.0–17.0)
MCH: 31.4 pg (ref 26.0–34.0)
MCHC: 34.2 g/dL (ref 30.0–36.0)
MCV: 91.8 fL (ref 78.0–100.0)
Platelets: 236 10*3/uL (ref 150–400)
RBC: 4.04 MIL/uL — AB (ref 4.22–5.81)
RDW: 13.4 % (ref 11.5–15.5)
WBC: 14.6 10*3/uL — ABNORMAL HIGH (ref 4.0–10.5)

## 2016-12-04 LAB — SAMPLE TO BLOOD BANK

## 2016-12-04 LAB — ETHANOL: Alcohol, Ethyl (B): 156 mg/dL — ABNORMAL HIGH (ref <5)

## 2016-12-04 LAB — URINALYSIS, MICROSCOPIC (REFLEX): RBC / HPF: NONE SEEN RBC/hpf (ref 0–5)

## 2016-12-04 LAB — PROTIME-INR
INR: 1.8
PROTHROMBIN TIME: 21.1 s — AB (ref 11.4–15.2)

## 2016-12-04 LAB — I-STAT CG4 LACTIC ACID, ED: Lactic Acid, Venous: 3.25 mmol/L (ref 0.5–1.9)

## 2016-12-04 MED ORDER — HYDROMORPHONE HCL 1 MG/ML IJ SOLN
INTRAMUSCULAR | Status: AC
  Filled 2016-12-04: qty 1

## 2016-12-04 MED ORDER — IOPAMIDOL (ISOVUE-370) INJECTION 76%
INTRAVENOUS | Status: AC
  Filled 2016-12-04: qty 50

## 2016-12-04 MED ORDER — SODIUM CHLORIDE 0.9 % IV SOLN
INTRAVENOUS | Status: AC | PRN
  Administered 2016-12-04: 1000 mL via INTRAVENOUS

## 2016-12-04 MED ORDER — MORPHINE SULFATE 2 MG/ML IV SOLN
INTRAVENOUS | Status: DC
  Administered 2016-12-04: via INTRAVENOUS
  Administered 2016-12-05: 11 mg via INTRAVENOUS
  Administered 2016-12-05: 12 mg via INTRAVENOUS
  Administered 2016-12-05: 4 mg via INTRAVENOUS
  Administered 2016-12-05: 15:00:00 via INTRAVENOUS
  Administered 2016-12-05: 7 mg via INTRAVENOUS
  Administered 2016-12-05: 6 mg via INTRAVENOUS
  Administered 2016-12-06: 12 mg via INTRAVENOUS
  Administered 2016-12-06: 7 mg via INTRAVENOUS
  Administered 2016-12-06: 08:00:00 via INTRAVENOUS
  Filled 2016-12-04 (×3): qty 25

## 2016-12-04 MED ORDER — NALOXONE HCL 0.4 MG/ML IJ SOLN: 0.4000 mg | INTRAMUSCULAR | Status: DC | PRN

## 2016-12-04 MED ORDER — DIPHENHYDRAMINE HCL 50 MG/ML IJ SOLN: 12.5000 mg | Freq: Four times a day (QID) | INTRAMUSCULAR | Status: DC | PRN

## 2016-12-04 MED ORDER — ONDANSETRON HCL 4 MG/2ML IJ SOLN: 4.0000 mg | Freq: Four times a day (QID) | INTRAMUSCULAR | Status: DC | PRN

## 2016-12-04 MED ORDER — MORPHINE SULFATE (PF) 2 MG/ML IV SOLN
INTRAVENOUS | Status: AC
  Filled 2016-12-04: qty 1

## 2016-12-04 MED ORDER — SODIUM CHLORIDE 0.9 % IV BOLUS (SEPSIS)
1000.0000 mL | Freq: Once | INTRAVENOUS | Status: AC
  Administered 2016-12-04: 1000 mL via INTRAVENOUS

## 2016-12-04 MED ORDER — SODIUM CHLORIDE 0.9% FLUSH: 9.0000 mL | INTRAVENOUS | Status: DC | PRN

## 2016-12-04 MED ORDER — DOCUSATE SODIUM 100 MG PO CAPS
100.0000 mg | ORAL_CAPSULE | Freq: Two times a day (BID) | ORAL | Status: DC
  Administered 2016-12-05 (×2): 100 mg via ORAL
  Filled 2016-12-04 (×2): qty 1

## 2016-12-04 MED ORDER — MORPHINE SULFATE (PF) 4 MG/ML IV SOLN
4.0000 mg | Freq: Once | INTRAVENOUS | Status: AC
  Administered 2016-12-04: 4 mg via INTRAVENOUS
  Filled 2016-12-04: qty 1

## 2016-12-04 MED ORDER — KCL IN DEXTROSE-NACL 20-5-0.45 MEQ/L-%-% IV SOLN
INTRAVENOUS | Status: DC
  Administered 2016-12-04 – 2016-12-05 (×2): via INTRAVENOUS
  Administered 2016-12-07: 75 mL via INTRAVENOUS
  Administered 2016-12-07: 23:00:00 via INTRAVENOUS
  Filled 2016-12-04 (×8): qty 1000

## 2016-12-04 MED ORDER — FENTANYL CITRATE (PF) 100 MCG/2ML IJ SOLN
50.0000 ug | Freq: Once | INTRAMUSCULAR | Status: AC
  Administered 2016-12-04: 50 ug via INTRAVENOUS

## 2016-12-04 MED ORDER — ONDANSETRON HCL 4 MG/2ML IJ SOLN
4.0000 mg | Freq: Once | INTRAMUSCULAR | Status: AC
  Administered 2016-12-04: 4 mg via INTRAVENOUS
  Filled 2016-12-04: qty 2

## 2016-12-04 MED ORDER — ACETAMINOPHEN 325 MG PO TABS: 650.0000 mg | ORAL_TABLET | ORAL | Status: DC | PRN

## 2016-12-04 MED ORDER — ONDANSETRON HCL 4 MG PO TABS: 4.0000 mg | ORAL_TABLET | Freq: Four times a day (QID) | ORAL | Status: DC | PRN

## 2016-12-04 MED ORDER — DIPHENHYDRAMINE HCL 12.5 MG/5ML PO ELIX: 12.5000 mg | ORAL_SOLUTION | Freq: Four times a day (QID) | ORAL | Status: DC | PRN

## 2016-12-04 MED ORDER — CEFAZOLIN SODIUM-DEXTROSE 2-4 GM/100ML-% IV SOLN
2.0000 g | Freq: Once | INTRAVENOUS | Status: AC
  Administered 2016-12-05: 2 g via INTRAVENOUS
  Filled 2016-12-04: qty 100

## 2016-12-04 MED ORDER — HYDROMORPHONE HCL 1 MG/ML IJ SOLN
1.0000 mg | Freq: Once | INTRAMUSCULAR | Status: AC
  Administered 2016-12-04: 1 mg via INTRAVENOUS

## 2016-12-04 MED ORDER — TETANUS-DIPHTH-ACELL PERTUSSIS 5-2.5-18.5 LF-MCG/0.5 IM SUSP
0.5000 mL | Freq: Once | INTRAMUSCULAR | Status: AC
  Administered 2016-12-04: 0.5 mL via INTRAMUSCULAR
  Filled 2016-12-04: qty 0.5

## 2016-12-04 MED ORDER — FENTANYL CITRATE (PF) 100 MCG/2ML IJ SOLN
INTRAMUSCULAR | Status: AC
  Administered 2016-12-04: 50 ug via INTRAVENOUS
  Filled 2016-12-04: qty 2
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
## 2016-12-04 NOTE — ED Notes (Signed)
FAST EXAM FOR ABDOMEN IS NEGATIVE
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
## 2016-12-04 NOTE — ED Notes (Signed)
Pt taken off NRB to see Room air o2 satursations. Dropped down to 86%. Attempted [place], pt not taking adquate breaths due to pain in chest. PT placed back on NRB. o2 95% and rising.
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
## 2016-12-04 NOTE — Progress Notes (Signed)
Orthopedic Tech Progress Note Patient Details:  Taylor Wise Jun 30, 1966 [id number]  Ortho Devices Ortho Device/Splint Location: Trauma   Saul FordyceJennifer C Rakesha Dalporto 11/25/2016, 6:40 PM

## 2016-12-04 NOTE — Progress Notes (Signed)
Orthopedic Tech Progress Note Patient Details:  Taylor Wise July 17, 1966 [id number]  Ortho Devices Type of Ortho Device: Post (short leg) splint, Stirrup splint, Knee Immobilizer Ortho Device/Splint Location: rle Ortho Device/Splint Interventions: Ordered, Application Applied splints as per drs verbal order with drs assistance.   Taylor Wise, Taylor Wise 11/17/2016, 10:37 PM

## 2016-12-04 NOTE — ED Notes (Signed)
Pt placed on venti mask by respiratory therapy

## 2016-12-04 NOTE — ED Notes (Signed)
cbg 195 per ems

## 2016-12-04 NOTE — Progress Notes (Signed)
Ice packs applied around R ankle per PA Montez MoritaKeith Paul.
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
## 2016-12-04 NOTE — ED Notes (Signed)
Pt to CT with this nurse.
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
## 2016-12-05 ENCOUNTER — Encounter (HOSPITAL_COMMUNITY): Payer: Self-pay | Admitting: Certified Registered Nurse Anesthetist

## 2016-12-05 ENCOUNTER — Inpatient Hospital Stay (HOSPITAL_COMMUNITY): Payer: Medicaid Other | Admitting: Certified Registered Nurse Anesthetist

## 2016-12-05 ENCOUNTER — Inpatient Hospital Stay (HOSPITAL_COMMUNITY): Payer: Medicaid Other

## 2016-12-05 LAB — CBC
HCT: 33.8 % — ABNORMAL LOW (ref 39.0–52.0)
HEMOGLOBIN: 11.3 g/dL — AB (ref 13.0–17.0)
MCH: 31.4 pg (ref 26.0–34.0)
MCHC: 33.4 g/dL (ref 30.0–36.0)
MCV: 93.9 fL (ref 78.0–100.0)
Platelets: 206 10*3/uL (ref 150–400)
RBC: 3.6 MIL/uL — ABNORMAL LOW (ref 4.22–5.81)
RDW: 13.8 % (ref 11.5–15.5)
WBC: 10.6 10*3/uL — AB (ref 4.0–10.5)

## 2016-12-05 LAB — BASIC METABOLIC PANEL
ANION GAP: 11 (ref 5–15)
BUN: 15 mg/dL (ref 6–20)
CHLORIDE: 107 mmol/L (ref 101–111)
CO2: 21 mmol/L — ABNORMAL LOW (ref 22–32)
Calcium: 8.3 mg/dL — ABNORMAL LOW (ref 8.9–10.3)
Creatinine, Ser: 0.86 mg/dL (ref 0.61–1.24)
GFR calc non Af Amer: >60 mL/min (ref >60)
Glucose, Bld: 176 mg/dL — ABNORMAL HIGH (ref 65–99)
POTASSIUM: 3.9 mmol/L (ref 3.5–5.1)
SODIUM: 139 mmol/L (ref 135–145)

## 2016-12-05 LAB — RAPID URINE DRUG SCREEN, HOSP PERFORMED
AMPHETAMINES: NOT DETECTED
BENZODIAZEPINES: NOT DETECTED
Barbiturates: NOT DETECTED
Cocaine: NOT DETECTED
OPIATES: NOT DETECTED
Tetrahydrocannabinol: POSITIVE — AB

## 2016-12-05 LAB — APTT: aPTT: 39 seconds — ABNORMAL HIGH (ref 24–36)

## 2016-12-05 LAB — PROTIME-INR
INR: 2.02
INR: 2.42
INR: 2.65
PROTHROMBIN TIME: 26.8 s — AB (ref 11.4–15.2)
Prothrombin Time: 23.2 seconds — ABNORMAL HIGH (ref 11.4–15.2)
Prothrombin Time: 28.8 seconds — ABNORMAL HIGH (ref 11.4–15.2)

## 2016-12-05 LAB — ABO/RH: ABO/RH(D): A POS

## 2016-12-05 LAB — LACTIC ACID, PLASMA: LACTIC ACID, VENOUS: 1.3 mmol/L (ref 0.5–1.9)

## 2016-12-05 LAB — MRSA PCR SCREENING: MRSA by PCR: NEGATIVE

## 2016-12-05 LAB — SURGICAL PCR SCREEN
MRSA, PCR: NEGATIVE
Staphylococcus aureus: POSITIVE — AB

## 2016-12-05 MED ORDER — IPRATROPIUM-ALBUTEROL 0.5-2.5 (3) MG/3ML IN SOLN
3.0000 mL | Freq: Three times a day (TID) | RESPIRATORY_TRACT | Status: DC
  Administered 2016-12-05 – 2016-12-09 (×13): 3 mL via RESPIRATORY_TRACT
  Filled 2016-12-05 (×13): qty 3

## 2016-12-05 MED ORDER — MUPIROCIN 2 % EX OINT
1.0000 "application " | TOPICAL_OINTMENT | Freq: Two times a day (BID) | CUTANEOUS | Status: DC
  Administered 2016-12-05 – 2016-12-09 (×9): 1 via NASAL
  Filled 2016-12-05: qty 22

## 2016-12-05 MED ORDER — LIDOCAINE 2% (20 MG/ML) 5 ML SYRINGE
INTRAMUSCULAR | Status: AC
  Filled 2016-12-05: qty 5

## 2016-12-05 MED ORDER — MUPIROCIN 2 % EX OINT: 1.0000 "application " | TOPICAL_OINTMENT | Freq: Two times a day (BID) | CUTANEOUS | Status: DC

## 2016-12-05 MED ORDER — CHLORHEXIDINE GLUCONATE CLOTH 2 % EX PADS
6.0000 | MEDICATED_PAD | Freq: Every day | CUTANEOUS | Status: DC
  Administered 2016-12-05 – 2016-12-09 (×4): 6 via TOPICAL

## 2016-12-05 MED ORDER — MIDAZOLAM HCL 2 MG/2ML IJ SOLN
INTRAMUSCULAR | Status: AC
  Filled 2016-12-05: qty 2

## 2016-12-05 MED ORDER — ACETAMINOPHEN 10 MG/ML IV SOLN
1000.0000 mg | Freq: Four times a day (QID) | INTRAVENOUS | Status: AC
  Administered 2016-12-05 (×4): 1000 mg via INTRAVENOUS
  Filled 2016-12-05 (×4): qty 100

## 2016-12-05 MED ORDER — ALBUTEROL SULFATE (2.5 MG/3ML) 0.083% IN NEBU
2.5000 mg | INHALATION_SOLUTION | RESPIRATORY_TRACT | Status: DC | PRN
  Administered 2016-12-05 – 2016-12-10 (×3): 2.5 mg via RESPIRATORY_TRACT
  Filled 2016-12-05 (×3): qty 3

## 2016-12-05 MED ORDER — ONDANSETRON HCL 4 MG/2ML IJ SOLN
INTRAMUSCULAR | Status: AC
  Filled 2016-12-05: qty 2

## 2016-12-05 MED ORDER — ROCURONIUM BROMIDE 50 MG/5ML IV SOSY
PREFILLED_SYRINGE | INTRAVENOUS | Status: AC
  Filled 2016-12-05: qty 5

## 2016-12-05 MED ORDER — METHOCARBAMOL 1000 MG/10ML IJ SOLN
1000.0000 mg | Freq: Three times a day (TID) | INTRAVENOUS | Status: DC
  Administered 2016-12-05: 1000 mg via INTRAVENOUS
  Filled 2016-12-05 (×2): qty 10

## 2016-12-05 MED ORDER — PROPOFOL 10 MG/ML IV BOLUS
INTRAVENOUS | Status: AC
  Filled 2016-12-05: qty 20

## 2016-12-05 MED ORDER — ORAL CARE MOUTH RINSE
15.0000 mL | Freq: Two times a day (BID) | OROMUCOSAL | Status: DC
  Administered 2016-12-05 – 2016-12-06 (×5): 15 mL via OROMUCOSAL

## 2016-12-05 MED ORDER — CHLORHEXIDINE GLUCONATE CLOTH 2 % EX PADS: 6.0000 | MEDICATED_PAD | Freq: Every day | CUTANEOUS | Status: DC

## 2016-12-05 MED ORDER — METHOCARBAMOL 1000 MG/10ML IJ SOLN
1000.0000 mg | Freq: Three times a day (TID) | INTRAVENOUS | Status: DC
  Administered 2016-12-05 – 2016-12-08 (×9): 1000 mg via INTRAVENOUS
  Filled 2016-12-05 (×14): qty 10

## 2016-12-05 MED ORDER — FENTANYL CITRATE (PF) 100 MCG/2ML IJ SOLN
INTRAMUSCULAR | Status: AC
  Filled 2016-12-05: qty 4

## 2016-12-05 MED ORDER — SALINE SPRAY 0.65 % NA SOLN
4.0000 | Freq: Four times a day (QID) | NASAL | Status: DC
  Administered 2016-12-05 – 2016-12-09 (×14): 4 via NASAL
  Filled 2016-12-05: qty 44

## 2016-12-05 MED ORDER — LACTATED RINGERS IV SOLN
INTRAVENOUS | Status: AC | PRN
  Administered 2016-12-05: 08:00:00 via INTRAVENOUS
  Administered 2016-12-06: 50 mL/h
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
## 2016-12-05 NOTE — Progress Notes (Signed)
Pt's face cleansed with soap and water. Large amounts of dried and coagulated blood removed, and three lacerations noted to medial chin, right chin, and right lateral neck. Wounds gently cleansed with saline and dressed with gauze dressing.

## 2016-12-05 NOTE — Progress Notes (Signed)
PT Cancellation Note  Patient Details Name: Taylor Wise MRN: [id number] DOB: [birth year]   Cancelled Treatment:    Reason Eval/Treat Not Completed: Medical issues which prohibited therapy (pt in OR this am and will plan to evaluate next date)   [name] 12/05/2016, 7:36 AM [name], PT [phone]
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
## 2016-12-05 NOTE — Progress Notes (Signed)
OT Cancellation Note  Patient Details Name: [name] JanRobby Coral MRN: [id number] DOB: 02-14-1966   Cancelled Treatment:    Reason Eval/Treat Not Completed: Patient at procedure or test/ unavailable (pt in OR this am and will plan to evaluate next date)  Evette GeorgesLeonard, Kal Chait Eva [phone] 12/05/2016, 7:38 AM

## 2016-12-05 NOTE — Anesthesia Preprocedure Evaluation (Addendum)
Anesthesia Evaluation  Patient identified by MRN, date of birth, ID band Patient awake    Reviewed: Allergy & Precautions, NPO status , Patient's Chart, lab work & pertinent test results  Airway Mallampati: II  TM Distance: >3 FB Neck ROM: Full    Dental no notable dental hx. (+) Dental Advisory Given   Pulmonary neg pulmonary ROS,    Pulmonary exam normal breath sounds clear to auscultation       Cardiovascular hypertension, Normal cardiovascular exam Rhythm:Regular Rate:Normal     Neuro/Psych negative neurological ROS  negative psych ROS   GI/Hepatic negative GI ROS, Neg liver ROS,   Endo/Other  negative endocrine ROS  Renal/GU negative Renal ROS  negative genitourinary   Musculoskeletal negative musculoskeletal ROS (+)   Abdominal   Peds negative pediatric ROS (+)  Hematology negative hematology ROS (+)   Anesthesia Other Findings   Reproductive/Obstetrics negative OB ROS                            Anesthesia Physical Anesthesia Plan  ASA: II and emergent  Anesthesia Plan: General   Post-op Pain Management:    Induction: Intravenous  Airway Management Planned: Oral ETT and Video Laryngoscope Planned  Additional Equipment:   Intra-op Plan:   Post-operative Plan: Extubation in OR  Informed Consent: I have reviewed the patients History and Physical, chart, labs and discussed the procedure including the risks, benefits and alternatives for the proposed anesthesia with the patient or authorized representative who has indicated his/her understanding and acceptance.   Dental advisory given  Plan Discussed with: CRNA and Surgeon  Anesthesia Plan Comments:        Anesthesia Quick Evaluation

## 2016-12-05 NOTE — Progress Notes (Signed)
OR tech picked up pt for transport to OR. Pt stable with no acute concerns at time of handoff.

## 2016-12-05 NOTE — Progress Notes (Signed)
Pt doesn't have to lay flat per Dr. Sheliah HatchKinsinger. Also, neurosurg consulted, said C-collar could be removed.   Collar removed with Dr. Sheliah HatchKinsinger at bedside. Pt HOB raised to 30 degrees. Pt c/o hx of hiatal hernia. Assessed, no concerns at this time. Will continue to monitor.  Pt's pain much better managed on PCA.

## 2016-12-05 DEATH — deceased

## 2016-12-06 ENCOUNTER — Encounter (HOSPITAL_COMMUNITY): Admission: EM | Disposition: E | Payer: Self-pay | Source: Home / Self Care

## 2016-12-06 ENCOUNTER — Inpatient Hospital Stay (HOSPITAL_COMMUNITY): Payer: Medicaid Other

## 2016-12-06 ENCOUNTER — Inpatient Hospital Stay (HOSPITAL_COMMUNITY): Payer: Medicaid Other | Admitting: Anesthesiology

## 2016-12-06 ENCOUNTER — Encounter (HOSPITAL_COMMUNITY): Payer: Self-pay

## 2016-12-06 HISTORY — PX: CHEST TUBE INSERTION: SHX231

## 2016-12-06 HISTORY — PX: FACIAL LACERATION REPAIR: SHX6589

## 2016-12-06 HISTORY — PX: FASCIECTOMY: SHX6525

## 2016-12-06 HISTORY — PX: ORIF TIBIA FRACTURE: SHX5416

## 2016-12-06 HISTORY — PX: TIBIA IM NAIL INSERTION: SHX2516

## 2016-12-06 LAB — COMPREHENSIVE METABOLIC PANEL
ALBUMIN: 3.1 g/dL — AB (ref 3.5–5.0)
ALT: 48 U/L (ref 17–63)
ANION GAP: 8 (ref 5–15)
AST: 47 U/L — ABNORMAL HIGH (ref 15–41)
Alkaline Phosphatase: 49 U/L (ref 38–126)
BUN: 17 mg/dL (ref 6–20)
CALCIUM: 8.5 mg/dL — AB (ref 8.9–10.3)
CHLORIDE: 106 mmol/L (ref 101–111)
CO2: 23 mmol/L (ref 22–32)
Creatinine, Ser: 0.97 mg/dL (ref 0.61–1.24)
GFR calc non Af Amer: >60 mL/min (ref >60)
GLUCOSE: 162 mg/dL — AB (ref 65–99)
Potassium: 4 mmol/L (ref 3.5–5.1)
SODIUM: 137 mmol/L (ref 135–145)
Total Bilirubin: 0.9 mg/dL (ref 0.3–1.2)
Total Protein: 5.9 g/dL — ABNORMAL LOW (ref 6.5–8.1)

## 2016-12-06 LAB — CBC
HEMATOCRIT: 29.8 % — AB (ref 39.0–52.0)
HEMOGLOBIN: 10.1 g/dL — AB (ref 13.0–17.0)
MCH: 31 pg (ref 26.0–34.0)
MCHC: 33.9 g/dL (ref 30.0–36.0)
MCV: 91.4 fL (ref 78.0–100.0)
Platelets: 193 10*3/uL (ref 150–400)
RBC: 3.26 MIL/uL — ABNORMAL LOW (ref 4.22–5.81)
RDW: 13.6 % (ref 11.5–15.5)
WBC: 9.4 10*3/uL (ref 4.0–10.5)

## 2016-12-06 LAB — TRIGLYCERIDES: Triglycerides: 171 mg/dL — ABNORMAL HIGH (ref <150)

## 2016-12-06 LAB — PROTIME-INR
INR: 2.3
Prothrombin Time: 25.7 seconds — ABNORMAL HIGH (ref 11.4–15.2)

## 2016-12-06 SURGERY — INSERTION, INTRAMEDULLARY ROD, TIBIA
Anesthesia: General | Site: Leg Lower | Laterality: Right

## 2016-12-06 SURGERY — INSERTION, INTRAMEDULLARY ROD, TIBIA
Anesthesia: General | Site: Neck | Laterality: Right

## 2016-12-06 MED ORDER — MIDAZOLAM HCL 2 MG/2ML IJ SOLN
INTRAMUSCULAR | Status: AC
  Filled 2016-12-06: qty 2

## 2016-12-06 MED ORDER — PANTOPRAZOLE SODIUM 40 MG IV SOLR
40.0000 mg | Freq: Every day | INTRAVENOUS | Status: DC
  Administered 2016-12-06 – 2016-12-09 (×3): 40 mg via INTRAVENOUS
  Filled 2016-12-06 (×4): qty 40

## 2016-12-06 MED ORDER — PROPOFOL 10 MG/ML IV BOLUS
INTRAVENOUS | Status: DC | PRN
  Administered 2016-12-06: 50 mg via INTRAVENOUS
  Administered 2016-12-06 (×2): 150 mg via INTRAVENOUS
  Administered 2016-12-06: 50 mg via INTRAVENOUS

## 2016-12-06 MED ORDER — FENTANYL 2500MCG IN NS 250ML (10MCG/ML) PREMIX INFUSION
25.0000 ug/h | INTRAVENOUS | Status: DC
  Administered 2016-12-06: 50 ug/h via INTRAVENOUS
  Filled 2016-12-06: qty 250

## 2016-12-06 MED ORDER — SUCCINYLCHOLINE 20MG/ML (10ML) SYRINGE FOR MEDFUSION PUMP - OPTIME
INTRAMUSCULAR | Status: DC | PRN
  Administered 2016-12-06: 40 mg via INTRAVENOUS
  Administered 2016-12-06: 120 mg via INTRAVENOUS
  Administered 2016-12-06: 40 mg via INTRAVENOUS

## 2016-12-06 MED ORDER — FENTANYL CITRATE (PF) 100 MCG/2ML IJ SOLN
INTRAMUSCULAR | Status: AC
  Filled 2016-12-06: qty 2

## 2016-12-06 MED ORDER — 0.9 % SODIUM CHLORIDE (POUR BTL) OPTIME
TOPICAL | Status: DC | PRN
  Administered 2016-12-06: 1000 mL

## 2016-12-06 MED ORDER — DEXMEDETOMIDINE HCL IN NACL 200 MCG/50ML IV SOLN
INTRAVENOUS | Status: AC
  Filled 2016-12-06: qty 50

## 2016-12-06 MED ORDER — ACETAMINOPHEN 10 MG/ML IV SOLN
1000.0000 mg | Freq: Four times a day (QID) | INTRAVENOUS | Status: AC
Start: 2016-12-06 — End: 2016-12-07
  Administered 2016-12-07 (×2): 1000 mg via INTRAVENOUS
  Filled 2016-12-06 (×2): qty 100

## 2016-12-06 MED ORDER — CEFAZOLIN SODIUM-DEXTROSE 2-3 GM-% IV SOLR
INTRAVENOUS | Status: DC | PRN
  Administered 2016-12-06: 3 g via INTRAVENOUS

## 2016-12-06 MED ORDER — SODIUM CHLORIDE 0.9 % IV SOLN
Freq: Once | INTRAVENOUS | Status: AC
  Administered 2016-12-06: 10 mL via INTRAVENOUS

## 2016-12-06 MED ORDER — ORAL CARE MOUTH RINSE
15.0000 mL | Freq: Four times a day (QID) | OROMUCOSAL | Status: DC
  Administered 2016-12-07 – 2016-12-08 (×8): 15 mL via OROMUCOSAL

## 2016-12-06 MED ORDER — ROCURONIUM BROMIDE 100 MG/10ML IV SOLN
INTRAVENOUS | Status: DC | PRN
  Administered 2016-12-06 (×2): 50 mg via INTRAVENOUS

## 2016-12-06 MED ORDER — LIDOCAINE HCL (CARDIAC) 20 MG/ML IV SOLN
INTRAVENOUS | Status: DC | PRN
  Administered 2016-12-06: 100 mg via INTRAVENOUS

## 2016-12-06 MED ORDER — VECURONIUM BROMIDE 10 MG IV SOLR
INTRAVENOUS | Status: AC
Start: 2016-12-06 — End: 2016-12-06
  Filled 2016-12-06: qty 20

## 2016-12-06 MED ORDER — ARTIFICIAL TEARS OP OINT
TOPICAL_OINTMENT | OPHTHALMIC | Status: DC | PRN
  Administered 2016-12-06: 1 via OPHTHALMIC

## 2016-12-06 MED ORDER — FENTANYL CITRATE (PF) 100 MCG/2ML IJ SOLN
INTRAMUSCULAR | Status: DC | PRN
  Administered 2016-12-06: 100 ug via INTRAVENOUS
  Administered 2016-12-06: 25 ug via INTRAVENOUS
  Administered 2016-12-06: 100 ug via INTRAVENOUS
  Administered 2016-12-06 (×4): 50 ug via INTRAVENOUS
  Administered 2016-12-06: 100 ug via INTRAVENOUS
  Administered 2016-12-06: 25 ug via INTRAVENOUS
  Administered 2016-12-06: 50 ug via INTRAVENOUS

## 2016-12-06 MED ORDER — VECURONIUM BROMIDE 10 MG IV SOLR
INTRAVENOUS | Status: DC | PRN
  Administered 2016-12-06: 1 mg via INTRAVENOUS
  Administered 2016-12-06: 4 mg via INTRAVENOUS
  Administered 2016-12-06: 5 mg via INTRAVENOUS

## 2016-12-06 MED ORDER — LACTATED RINGERS IV SOLN
INTRAVENOUS | Status: DC | PRN
  Administered 2016-12-06 (×2): via INTRAVENOUS

## 2016-12-06 MED ORDER — CEFAZOLIN SODIUM 1 G IJ SOLR
INTRAMUSCULAR | Status: AC
Start: 2016-12-06 — End: 2016-12-06
  Filled 2016-12-06: qty 30

## 2016-12-06 MED ORDER — DEXAMETHASONE SODIUM PHOSPHATE 10 MG/ML IJ SOLN
INTRAMUSCULAR | Status: DC | PRN
  Administered 2016-12-06: 10 mg via INTRAVENOUS

## 2016-12-06 MED ORDER — PROPOFOL 10 MG/ML IV BOLUS
INTRAVENOUS | Status: AC
  Filled 2016-12-06: qty 20

## 2016-12-06 MED ORDER — PANTOPRAZOLE SODIUM 40 MG PO TBEC: 40.0000 mg | DELAYED_RELEASE_TABLET | Freq: Every day | ORAL | Status: DC

## 2016-12-06 MED ORDER — ALBUMIN HUMAN 5 % IV SOLN
INTRAVENOUS | Status: DC | PRN
  Administered 2016-12-06: 15:00:00 via INTRAVENOUS

## 2016-12-06 MED ORDER — PROPOFOL 1000 MG/100ML IV EMUL
5.0000 ug/kg/min | INTRAVENOUS | Status: DC
  Administered 2016-12-06 (×2): 50 ug/kg/min via INTRAVENOUS
  Administered 2016-12-07 (×3): 60 ug/kg/min via INTRAVENOUS
  Administered 2016-12-07: 50 ug/kg/min via INTRAVENOUS
  Administered 2016-12-07 (×5): 60 ug/kg/min via INTRAVENOUS
  Administered 2016-12-08 (×5): 70 ug/kg/min via INTRAVENOUS
  Filled 2016-12-06 (×19): qty 100

## 2016-12-06 MED ORDER — ACETAMINOPHEN 325 MG PO TABS: 650.0000 mg | ORAL_TABLET | ORAL | Status: DC | PRN

## 2016-12-06 MED ORDER — PROPOFOL 500 MG/50ML IV EMUL
INTRAVENOUS | Status: DC | PRN
  Administered 2016-12-06: 50 ug/kg/min via INTRAVENOUS

## 2016-12-06 MED ORDER — CHLORHEXIDINE GLUCONATE 0.12% ORAL RINSE (MEDLINE KIT)
15.0000 mL | Freq: Two times a day (BID) | OROMUCOSAL | Status: DC
  Administered 2016-12-06 – 2016-12-08 (×4): 15 mL via OROMUCOSAL

## 2016-12-06 SURGICAL SUPPLY — 131 items
BANDAGE ACE 4X5 VEL STRL LF (GAUZE/BANDAGES/DRESSINGS) ×6 IMPLANT
BANDAGE ACE 6X5 VEL STRL LF (GAUZE/BANDAGES/DRESSINGS) ×6 IMPLANT
BANDAGE ESMARK 6X9 LF (GAUZE/BANDAGES/DRESSINGS) ×4 IMPLANT
BENZOIN TINCTURE PRP APPL 2/3 (GAUZE/BANDAGES/DRESSINGS) IMPLANT
BIT DRILL 100X2.5XANTM LCK (BIT) ×4 IMPLANT
BIT DRILL 2.5X2.75 QC CALB (BIT) ×6 IMPLANT
BIT DRILL 3.2 STERILE (BIT) ×4
BIT DRILL 3.2MM STRL (BIT) ×4 IMPLANT
BIT DRILL 3.5X5.5 QC CALB (BIT) ×6 IMPLANT
BIT DRILL CAL (BIT) ×4 IMPLANT
BIT DRILL CALIBRATED 4.3X320MM (BIT) ×4 IMPLANT
BIT DRILL CROWE POINT TWST 4.3 (DRILL) ×4 IMPLANT
BIT DRL 100X2.5XANTM LCK (BIT) ×4
BLADE SURG 10 STRL SS (BLADE) IMPLANT
BLADE SURG 15 STRL LF DISP TIS (BLADE) IMPLANT
BLADE SURG 15 STRL SS (BLADE)
BLADE SURG ROTATE 9660 (MISCELLANEOUS) ×6 IMPLANT
BNDG COHESIVE 4X5 TAN STRL (GAUZE/BANDAGES/DRESSINGS) IMPLANT
BNDG ELASTIC 6X10 VLCR STRL LF (GAUZE/BANDAGES/DRESSINGS) ×6 IMPLANT
BNDG ESMARK 6X9 LF (GAUZE/BANDAGES/DRESSINGS) ×6
BNDG GAUZE ELAST 4 BULKY (GAUZE/BANDAGES/DRESSINGS) ×6 IMPLANT
BRUSH SCRUB DISP (MISCELLANEOUS) ×12 IMPLANT
CANISTER SUCTION 2500CC (MISCELLANEOUS) IMPLANT
CATH TROCAR 28FR (CATHETERS) ×6 IMPLANT
CLOSURE WOUND 1/2 X4 (GAUZE/BANDAGES/DRESSINGS)
COVER MAYO STAND STRL (DRAPES) IMPLANT
COVER SURGICAL LIGHT HANDLE (MISCELLANEOUS) ×6 IMPLANT
DRAPE C-ARM 42X72 X-RAY (DRAPES) ×6 IMPLANT
DRAPE C-ARMOR (DRAPES) ×6 IMPLANT
DRAPE HALF SHEET 40X57 (DRAPES) ×6 IMPLANT
DRAPE IMP U-DRAPE 54X76 (DRAPES) ×6 IMPLANT
DRAPE INCISE IOBAN 66X45 STRL (DRAPES) ×6 IMPLANT
DRAPE ORTHO SPLIT 77X108 STRL (DRAPES) ×2
DRAPE PROXIMA HALF (DRAPES) ×6 IMPLANT
DRAPE SURG ORHT 6 SPLT 77X108 (DRAPES) ×4 IMPLANT
DRAPE U-SHAPE 47X51 STRL (DRAPES) ×6 IMPLANT
DRILL BIT 2.5MM (BIT) ×2
DRILL BIT 3.2MM STERILE (BIT) ×2
DRILL BIT CAL (BIT) ×6
DRILL CALIBRATED 4.3X320MM (BIT) ×6
DRILL CROWE POINT TWIST 4.3 (DRILL) ×6
DRSG ADAPTIC 3X8 NADH LF (GAUZE/BANDAGES/DRESSINGS) ×12 IMPLANT
DRSG PAD ABDOMINAL 8X10 ST (GAUZE/BANDAGES/DRESSINGS) ×24 IMPLANT
ELECT REM PT RETURN 9FT ADLT (ELECTROSURGICAL) ×6
ELECTRODE REM PT RTRN 9FT ADLT (ELECTROSURGICAL) ×4 IMPLANT
EVACUATOR 1/8 PVC DRAIN (DRAIN) IMPLANT
EVACUATOR 3/16  PVC DRAIN (DRAIN)
EVACUATOR 3/16 PVC DRAIN (DRAIN) IMPLANT
GAUZE SPONGE 4X4 12PLY STRL (GAUZE/BANDAGES/DRESSINGS) ×18 IMPLANT
GLOVE BIO SURGEON STRL SZ7.5 (GLOVE) ×6 IMPLANT
GLOVE BIO SURGEON STRL SZ8 (GLOVE) ×6 IMPLANT
GLOVE BIO SURGEON STRL SZ8.5 (GLOVE) ×6 IMPLANT
GLOVE BIOGEL M 7.0 STRL (GLOVE) IMPLANT
GLOVE BIOGEL PI IND STRL 7.5 (GLOVE) ×4 IMPLANT
GLOVE BIOGEL PI IND STRL 8 (GLOVE) ×4 IMPLANT
GLOVE BIOGEL PI INDICATOR 7.5 (GLOVE) ×2
GLOVE BIOGEL PI INDICATOR 8 (GLOVE) ×2
GLOVE PROGUARD SZ 7 1/2 (GLOVE) ×6 IMPLANT
GLOVE SS BIOGEL STRL SZ 6 (GLOVE) ×4 IMPLANT
GLOVE SUPERSENSE BIOGEL SZ 6 (GLOVE) ×2
GOWN STRL REUS W/ TWL LRG LVL3 (GOWN DISPOSABLE) ×16 IMPLANT
GOWN STRL REUS W/ TWL XL LVL3 (GOWN DISPOSABLE) ×4 IMPLANT
GOWN STRL REUS W/TWL LRG LVL3 (GOWN DISPOSABLE) ×8
GOWN STRL REUS W/TWL XL LVL3 (GOWN DISPOSABLE) ×2
GUIDEWIRE 2.6X80 BEAD TIP (WIRE) ×4 IMPLANT
GUIDWIRE 2.6X80 BEAD TIP (WIRE) ×6
IMMOBILIZER KNEE 22 UNIV (SOFTGOODS) ×6 IMPLANT
KIT BASIN OR (CUSTOM PROCEDURE TRAY) ×6 IMPLANT
KIT PLEURAL DRAIN ASPIRA BL (MISCELLANEOUS) ×6 IMPLANT
KIT ROOM TURNOVER OR (KITS) ×6 IMPLANT
MANIFOLD NEPTUNE II (INSTRUMENTS) ×6 IMPLANT
NAIL TIBIAL PHOENIX 7.5X370MM (Nail) ×5 IMPLANT
NDL SUT .5 MAYO 1.404X.05X (NEEDLE) IMPLANT
NDL SUT 6 .5 CRC .975X.05 MAYO (NEEDLE) ×4 IMPLANT
NEEDLE 22X1 1/2 (OR ONLY) (NEEDLE) IMPLANT
NEEDLE HYPO 25GX1X1/2 BEV (NEEDLE) IMPLANT
NEEDLE HYPO 25X1 1.5 SAFETY (NEEDLE) IMPLANT
NEEDLE MAYO TAPER (NEEDLE) ×2
NS IRRIG 1000ML POUR BTL (IV SOLUTION) ×12 IMPLANT
PACK EENT II TURBAN DRAPE (CUSTOM PROCEDURE TRAY) IMPLANT
PACK GENERAL/GYN (CUSTOM PROCEDURE TRAY) IMPLANT
PACK ORTHO EXTREMITY (CUSTOM PROCEDURE TRAY) ×6 IMPLANT
PACK UNIVERSAL I (CUSTOM PROCEDURE TRAY) IMPLANT
PAD ARMBOARD 7.5X6 YLW CONV (MISCELLANEOUS) ×24 IMPLANT
PAD CAST 4YDX4 CTTN HI CHSV (CAST SUPPLIES) ×4 IMPLANT
PADDING CAST COTTON 4X4 STRL (CAST SUPPLIES) ×2
PADDING CAST COTTON 6X4 STRL (CAST SUPPLIES) ×6 IMPLANT
PIN GUIDE 3.2 903003004 (MISCELLANEOUS) ×6 IMPLANT
PLATE LOCK 5H STD RT PROX TIB (Plate) ×6 IMPLANT
SCREW CORT TI DBL LEAD 4X42 (Screw) ×5 IMPLANT
SCREW CORT TI DBL LEAD 4X44 (Screw) ×5 IMPLANT
SCREW CORT TI DBL LEAD 4X54 (Screw) ×6 IMPLANT
SCREW CORT TI DBL LEAD 5X60 (Screw) ×6 IMPLANT
SCREW CORT TI DBL LEAD 5X70 (Screw) ×6 IMPLANT
SCREW CORTICAL 3.5MM  44MM (Screw) ×4 IMPLANT
SCREW CORTICAL 3.5MM 44MM (Screw) ×8 IMPLANT
SCREW CORTICAL 3.5MM 50MM (Screw) ×18 IMPLANT
SCREW LOCK CORT STAR 3.5X75 (Screw) ×6 IMPLANT
SCREW LOCK CORT STAR 3.5X80 (Screw) ×6 IMPLANT
SCREW LOCK CORT STAR 3.5X85 (Screw) ×6 IMPLANT
SCREW LOW PROF CORTICAL 3.5X80 (Screw) ×6 IMPLANT
SCREW LP 3.5X85MM (Screw) ×6 IMPLANT
SCREW LP 3.5X90MM (Screw) ×6 IMPLANT
SPONGE LAP 18X18 X RAY DECT (DISPOSABLE) ×6 IMPLANT
SPONGE NEURO XRAY DETECT 1X3 (DISPOSABLE) IMPLANT
STAPLER VISISTAT 35W (STAPLE) ×6 IMPLANT
STOCKINETTE IMPERVIOUS LG (DRAPES) IMPLANT
STRIP CLOSURE SKIN 1/2X4 (GAUZE/BANDAGES/DRESSINGS) IMPLANT
SUCTION FRAZIER HANDLE 10FR (MISCELLANEOUS) ×2
SUCTION TUBE FRAZIER 10FR DISP (MISCELLANEOUS) ×4 IMPLANT
SUT ETHILON 3 0 PS 1 (SUTURE) ×24 IMPLANT
SUT PROLENE 0 CT 2 (SUTURE) ×12 IMPLANT
SUT SILK 2 0 FS (SUTURE) IMPLANT
SUT VIC AB 0 CT1 27 (SUTURE) ×2
SUT VIC AB 0 CT1 27XBRD ANBCTR (SUTURE) ×4 IMPLANT
SUT VIC AB 1 CT1 27 (SUTURE)
SUT VIC AB 1 CT1 27XBRD ANBCTR (SUTURE) IMPLANT
SUT VIC AB 2-0 CT1 27 (SUTURE) ×2
SUT VIC AB 2-0 CT1 TAPERPNT 27 (SUTURE) ×4 IMPLANT
SUT VIC AB 2-0 CT3 27 (SUTURE) IMPLANT
SYR 20ML ECCENTRIC (SYRINGE) IMPLANT
SYR CONTROL 10ML LL (SYRINGE) IMPLANT
TAPE CLOTH SURG 4X10 WHT LF (GAUZE/BANDAGES/DRESSINGS) ×12 IMPLANT
TOWEL OR 17X24 6PK STRL BLUE (TOWEL DISPOSABLE) ×12 IMPLANT
TOWEL OR 17X26 10 PK STRL BLUE (TOWEL DISPOSABLE) ×12 IMPLANT
TRAY CHEST TUBE INSERTION (SET/KITS/TRAYS/PACK) ×6 IMPLANT
TRAY FOLEY CATH 16FRSI W/METER (SET/KITS/TRAYS/PACK) ×6 IMPLANT
TUBE CONNECTING 12'X1/4 (SUCTIONS) ×1
TUBE CONNECTING 12X1/4 (SUCTIONS) ×5 IMPLANT
WIRE K 1.6MM 144256 (MISCELLANEOUS) ×12 IMPLANT
YANKAUER SUCT BULB TIP NO VENT (SUCTIONS) ×6 IMPLANT

## 2016-12-06 NOTE — Anesthesia Procedure Notes (Signed)
Procedure Name: Intubation Date/Time: 12/08/2016 1:59 PM Performed by: Jacquiline Doe A Pre-anesthesia Checklist: Patient identified, Emergency Drugs available, Suction available and Patient being monitored Patient Re-evaluated:Patient Re-evaluated prior to inductionOxygen Delivery Method: Circle System Utilized Preoxygenation: Pre-oxygenation with 100% oxygen Intubation Type: IV induction Ventilation: Mask ventilation without difficulty, Two handed mask ventilation required and Mask ventilation with difficulty Laryngoscope Size: Mac, 4 and Glidescope Grade View: Grade I Tube type: Subglottic suction tube Tube size: 8.0 mm Number of attempts: 1 Airway Equipment and Method: Oral airway,  Video-laryngoscopy and Rigid stylet Placement Confirmation: ETT inserted through vocal cords under direct vision,  positive ETCO2 and breath sounds checked- equal and bilateral Secured at: 23 cm Tube secured with: Tape Dental Injury: Teeth and Oropharynx as per pre-operative assessment  Difficulty Due To: Difficulty was anticipated, Difficult Airway- due to large tongue, Difficult Airway- due to reduced neck mobility, Difficult Airway- due to anterior larynx, Difficult Airway-  due to edematous airway and Difficult Airway- due to dentition

## 2016-12-06 NOTE — Progress Notes (Signed)
PTA medications, PMH, and family history added to chart per pt report.   Of note, pt reported taking a cholesterol-lowering drug and a blood pressure lowering drug, but could not remember their names.  Also of note, pt has been on coumadin d/t tendency to get PEs/DVTs. This explains elevated PT/INR. Will relay this info to MD in morning.   Peyton Bottomsachel R Lakedra Washington, RN 12/20/2016

## 2016-12-06 NOTE — Anesthesia Preprocedure Evaluation (Signed)
Anesthesia Evaluation  Patient identified by MRN, date of birth, ID band Patient awake    Reviewed: Allergy & Precautions, NPO status , Patient's Chart, lab work & pertinent test results  Airway Mallampati: II  TM Distance: >3 FB Neck ROM: Full    Dental  (+) Teeth Intact, Dental Advisory Given   Pulmonary    + rhonchi  + decreased breath sounds+ wheezing      Cardiovascular hypertension,  Rhythm:Regular Rate:Normal     Neuro/Psych    GI/Hepatic   Endo/Other    Renal/GU      Musculoskeletal   Abdominal   Peds  Hematology   Anesthesia Other Findings   Reproductive/Obstetrics                             Anesthesia Physical Anesthesia Plan  ASA: [name]  Anesthesia Plan: General   Post-op Pain Management:    Induction: Intravenous  Airway Management Planned: Oral ETT  Additional Equipment:   Intra-op Plan:   Post-operative Plan: Extubation in OR  Informed Consent: I have reviewed the patients History and Physical, chart, labs and discussed the procedure including the risks, benefits and alternatives for the proposed anesthesia with the patient or authorized representative who has indicated his/her understanding and acceptance.   Dental advisory given  Plan Discussed with: CRNA and Anesthesiologist  Anesthesia Plan Comments:         Anesthesia Quick Evaluation

## 2016-12-06 NOTE — Procedures (Addendum)
Chest Tube Insertion Procedure Note  Indications:  Clinically significant Pneumothorax: Called to OR due to intraoperative hypoxia with intraop cxr concerning for left ptx.   Pre-operative Diagnosis: Pneumothorax  Post-operative Diagnosis: Pneumothorax  Procedure Details  After sterile skin prep, using standard technique, a 28 French tube was placed in the left lateral 5th rib space.  Findings: Small amount of blood and gurgle but no large rush of air  Estimated Blood Loss:  Minimal         Specimens:  None              Complications:  None; patient tolerated the procedure well.         Disposition: ICU - intubated and hemodynamically stable.         Condition: stable  Attending Attestation: I performed the procedure.

## 2016-12-06 NOTE — Anesthesia Procedure Notes (Signed)
Procedure Name: Intubation Date/Time: 12/17/2016 5:49 PM Performed by: Faustino CongressWHITE, Munirah Doerner TENA Yuchen Fedor Pre-anesthesia Checklist: Patient identified, Emergency Drugs available, Suction available, Patient being monitored and Timeout performed Patient Re-evaluated:Patient Re-evaluated prior to inductionOxygen Delivery Method: Circle system utilized Preoxygenation: Pre-oxygenation with 100% oxygen Intubation Type: Inhalational induction Laryngoscope Size: Glidescope and 4 Grade View: Grade I Tube type: Oral Tube size: 8.0 mm Number of attempts: 1 Intubation method: cook catheter  Placement Confirmation: ETT inserted through vocal cords under direct vision,  positive ETCO2,  CO2 detector and breath sounds checked- equal and bilateral Secured at: 24 cm Tube secured with: Tape Dental Injury: Teeth and Oropharynx as per pre-operative assessment
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
## 2016-12-06 NOTE — Anesthesia Postprocedure Evaluation (Signed)
Anesthesia Post Note  Patient: Taylor Wise  Procedure(s) Performed: Procedure(s) (LRB): INTRAMEDULLARY (IM) NAIL TIBIAL-RIGHT (Right) OPEN REDUCTION INTERNAL FIXATION (ORIF) TIBIA FRACTURE-RIGHT (Right) FASCIECTOMY (Right) right neck LACERATION REPAIR (Right) CHEST TUBE INSERTION (Left)  Patient location during evaluation: SICU Anesthesia Type: General Level of consciousness: sedated Pain management: pain level controlled Vital Signs Assessment: post-procedure vital signs reviewed and stable Respiratory status: patient remains intubated per anesthesia plan Cardiovascular status: stable Anesthetic complications: no       Last Vitals:  Vitals:   12/31/2016 1900 12/27/2016 1908  BP: 133/73   Pulse: 86 78  Resp: 15 15  Temp:      Last Pain:  Vitals:   12/17/2016 1030  TempSrc: Oral  PainSc:                  Caelen Reierson,W. EDMOND

## 2016-12-06 NOTE — Progress Notes (Signed)
OT Cancellation Note  Patient Details Name: Taylor Wise MRN: [id number] DOB: [birth year]   Cancelled Treatment:    Reason Eval/Treat Not Completed: Medical issues which prohibited therapy (pt awaiting OR and noted to be on bedrest per ortho note. Will attempt next date as applicable)  Evette GeorgesLeonard, Shyan Scalisi Eva [phone] 12/24/2016, 9:05 AM

## 2016-12-06 NOTE — Progress Notes (Signed)
RT met pt in OR to bring him over on the vent. Pt placed on our vent and transported to 2S15 with no issues.

## 2016-12-06 NOTE — Progress Notes (Signed)
CT Results interprets by Dr. Sterling BigKwon in Radiology were called to Dr. Fredricka Bonineonnor. Will continue to monitor the patient closely.   Marlou PorchBradley Melford Tullier

## 2016-12-06 NOTE — Progress Notes (Signed)
20ml morphine IV PCA WIS with Lynetta MareHeather Bowman RN

## 2016-12-06 NOTE — Progress Notes (Signed)
PT Cancellation Note  Patient Details Name: Taylor Wise MRN: [id number] DOB: 01-30-1966   Cancelled Treatment:    Reason Eval/Treat Not Completed: Medical issues which prohibited therapy (pt awaiting OR and noted to be on bedrest per ortho note. Will attempt next date as applicable)   Taylor Wise Taylor Wise 12/27/2016, 8:09 AM  Taylor Wise, PT [phone]

## 2016-12-06 NOTE — Transfer of Care (Signed)
Immediate Anesthesia Transfer of Care Note  Patient: Taylor Wise  Procedure(s) Performed: Procedure(s): INTRAMEDULLARY (IM) NAIL TIBIAL-RIGHT (Right) OPEN REDUCTION INTERNAL FIXATION (ORIF) TIBIA FRACTURE-RIGHT (Right) FASCIECTOMY (Right) right neck LACERATION REPAIR (Right) CHEST TUBE INSERTION (Left)  Patient Location: SICU  Anesthesia Type:General  Level of Consciousness: sedated and Patient remains intubated per anesthesia plan  Airway & Oxygen Therapy: Patient remains intubated per anesthesia plan and Patient placed on Ventilator (see vital sign flow sheet for setting)  Post-op Assessment: Report given to RN and Post -op Vital signs reviewed and stable  Post vital signs: Reviewed and stable  Last Vitals:  Vitals:   07/30/2017 1030 07/30/2017 1140  BP: 121/85 (!) 168/89  Pulse: 100 (!) 107  Resp: 13 16  Temp: 36.4 C     Last Pain:  Vitals:   07/30/2017 1030  TempSrc: Oral  PainSc:       Patients Stated Pain Goal: 2 (12/05/16 1600)  Complications: No apparent anesthesia complications

## 2016-12-06 NOTE — Brief Op Note (Signed)
12/07/15 - 12/19/2016  6:24 PM  PATIENT:  Taylor Wise  51 y.o. male  PRE-OPERATIVE DIAGNOSIS:   1. Right Tibial PLATEAU Fracture 2. RIGHT TIBIA AND FIBULA FRACTURES 3. NECK LACERATION, 7.5 CM THROUGH THE FASCIA  POST-OPERATIVE DIAGNOSIS:   1. Right Tibial PLATEAU Fracture 2. RIGHT TIBIA AND FIBULA FRACTURES 3. NECK LACERATION, 7.5 CM THROUGH THE FASCIA 4. PNEUMOTHORAX LEFT  PROCEDURE:  Procedure(s): 1. INTRAMEDULLARY (IM) NAIL TIBIAL-RIGHT (Right) BIOMET [place] 7.5MM X 370MM STATICALLY LOCKED 2. OPEN REDUCTION INTERNAL FIXATION (ORIF) TIBIAL PLATEAU LATERAL CONDYLE  FRACTURE-RIGHT 3. ANTERIOR COMPARTMENT FASCIOTOMY (Right) 4. EXCISIONAL DEBRIDEMENT TRAUMATIC NECK LACERATION 7.5CM 5. MULTILAYERED CLOSURE OF TRAUMATIC NECK LACERATION 7.5CM  CHEST TUBE INSERTION (Left)  SURGEON:  Surgeon(s) and Role: Panel 1:    * Myrene GalasMichael Zahirah Cheslock, MD - Primary  Panel 2:    * Berna Buehelsea A Connor, MD - Primary  PHYSICIAN ASSISTANT:   ASSISTANTS: Harlan Stainsiane Johnson, RN-FA   ANESTHESIA:   general  EBL:  Total I/O In: 2545.3 [I.V.:1752.3; Blood:483; IV Piggyback:310] Out: 450 [Urine:310; Blood:140]  BLOOD ADMINISTERED:none  DRAINS: chest tube per Dr. Fredricka Bonineonnor   LOCAL MEDICATIONS USED:  NONE  SPECIMEN:  No Specimen  DISPOSITION OF SPECIMEN:  N/A  COUNTS:  YES  TOURNIQUET:  * No tourniquets in log *  DICTATION: .Other Dictation: Dictation Number [id number]  PLAN OF CARE: Admit to inpatient   PATIENT DISPOSITION:  ICU - intubated and hemodynamically stable.   Delay start of Pharmacological VTE agent (>24hrs) due to surgical blood loss or risk of bleeding: per Trauma Service

## 2016-12-06 NOTE — Anesthesia Procedure Notes (Addendum)
Procedure Name: LMA Insertion Date/Time: 12/09/2016 1:54 PM Performed by: Wray KearnsFOLEY, Kanyla Omeara A Pre-anesthesia Checklist: Patient identified, Emergency Drugs available, Suction available, Patient being monitored and Timeout performed Patient Re-evaluated:Patient Re-evaluated prior to inductionOxygen Delivery Method: Circle system utilized Preoxygenation: Pre-oxygenation with 100% oxygen Intubation Type: IV induction Ventilation: Two handed mask ventilation required, Oral airway inserted - appropriate to patient size and Mask ventilation with difficulty LMA: LMA inserted LMA Size: 4.0 Tube type: Oral Number of attempts: 1 Placement Confirmation: positive ETCO2 Tube secured with: Tape Dental Injury: Teeth and Oropharynx as per pre-operative assessment
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
## 2016-12-07 ENCOUNTER — Encounter (HOSPITAL_COMMUNITY): Payer: Self-pay | Admitting: Orthopedic Surgery

## 2016-12-07 ENCOUNTER — Inpatient Hospital Stay (HOSPITAL_COMMUNITY): Payer: Medicaid Other

## 2016-12-07 LAB — CBC WITH DIFFERENTIAL/PLATELET
Basophils Absolute: 0 10*3/uL (ref 0.0–0.1)
Basophils Relative: 0 %
EOS ABS: 0 10*3/uL (ref 0.0–0.7)
Eosinophils Relative: 0 %
HCT: 19.8 % — ABNORMAL LOW (ref 39.0–52.0)
Hemoglobin: 6.7 g/dL — CL (ref 13.0–17.0)
LYMPHS ABS: 0.4 10*3/uL — AB (ref 0.7–4.0)
LYMPHS PCT: 7 %
MCH: 31 pg (ref 26.0–34.0)
MCHC: 33.8 g/dL (ref 30.0–36.0)
MCV: 91.7 fL (ref 78.0–100.0)
MONO ABS: 0.6 10*3/uL (ref 0.1–1.0)
Monocytes Relative: 12 %
Neutro Abs: 4.5 10*3/uL (ref 1.7–7.7)
Neutrophils Relative %: 81 %
Platelets: 172 10*3/uL (ref 150–400)
RBC: 2.16 MIL/uL — AB (ref 4.22–5.81)
RDW: 13.6 % (ref 11.5–15.5)
WBC: 5.5 10*3/uL (ref 4.0–10.5)

## 2016-12-07 LAB — COMPREHENSIVE METABOLIC PANEL
ALT: 32 U/L (ref 17–63)
AST: 36 U/L (ref 15–41)
Albumin: 2.6 g/dL — ABNORMAL LOW (ref 3.5–5.0)
Alkaline Phosphatase: 48 U/L (ref 38–126)
Anion gap: 6 (ref 5–15)
BUN: 20 mg/dL (ref 6–20)
CHLORIDE: 107 mmol/L (ref 101–111)
CO2: 26 mmol/L (ref 22–32)
CREATININE: 1 mg/dL (ref 0.61–1.24)
Calcium: 8.2 mg/dL — ABNORMAL LOW (ref 8.9–10.3)
GFR calc Af Amer: >60 mL/min (ref >60)
GFR calc non Af Amer: >60 mL/min (ref >60)
GLUCOSE: 176 mg/dL — AB (ref 65–99)
POTASSIUM: 4.3 mmol/L (ref 3.5–5.1)
Sodium: 139 mmol/L (ref 135–145)
Total Bilirubin: 0.5 mg/dL (ref 0.3–1.2)
Total Protein: 5.6 g/dL — ABNORMAL LOW (ref 6.5–8.1)

## 2016-12-07 LAB — POCT I-STAT 7, (LYTES, BLD GAS, ICA,H+H)
Bicarbonate: 25.5 mmol/L (ref 20.0–28.0)
CALCIUM ION: 1.16 mmol/L (ref 1.15–1.40)
HEMATOCRIT: 23 % — AB (ref 39.0–52.0)
HEMOGLOBIN: 7.8 g/dL — AB (ref 13.0–17.0)
O2 SAT: 93 %
PCO2 ART: 49.1 mmHg — AB (ref 32.0–48.0)
PH ART: 7.327 — AB (ref 7.350–7.450)
POTASSIUM: 4.4 mmol/L (ref 3.5–5.1)
Patient temperature: 37.7
Sodium: 137 mmol/L (ref 135–145)
TCO2: 27 mmol/L (ref 0–100)
pO2, Arterial: 76 mmHg — ABNORMAL LOW (ref 83.0–108.0)

## 2016-12-07 LAB — CBC
HCT: 22.2 % — ABNORMAL LOW (ref 39.0–52.0)
Hemoglobin: 7.6 g/dL — ABNORMAL LOW (ref 13.0–17.0)
MCH: 31.4 pg (ref 26.0–34.0)
MCHC: 34.2 g/dL (ref 30.0–36.0)
MCV: 91.7 fL (ref 78.0–100.0)
PLATELETS: 159 10*3/uL (ref 150–400)
RBC: 2.42 MIL/uL — AB (ref 4.22–5.81)
RDW: 13.3 % (ref 11.5–15.5)
WBC: 7.2 10*3/uL (ref 4.0–10.5)

## 2016-12-07 LAB — PREPARE FRESH FROZEN PLASMA
UNIT DIVISION: 0
UNIT DIVISION: 0

## 2016-12-07 LAB — APTT: APTT: 40 s — AB (ref 24–36)

## 2016-12-07 LAB — HEPARIN LEVEL (UNFRACTIONATED): Heparin Unfractionated: 0.43 [IU]/mL (ref 0.30–0.70)

## 2016-12-07 LAB — POCT I-STAT 3, ART BLOOD GAS (G3+)
BICARBONATE: 25.6 mmol/L (ref 20.0–28.0)
O2 SAT: 92 %
PCO2 ART: 47.6 mmHg (ref 32.0–48.0)
PO2 ART: 67 mmHg — AB (ref 83.0–108.0)
TCO2: 27 mmol/L (ref 0–100)
pH, Arterial: 7.338 — ABNORMAL LOW (ref 7.350–7.450)

## 2016-12-07 LAB — GLUCOSE, CAPILLARY
GLUCOSE-CAPILLARY: 166 mg/dL — AB (ref 65–99)
Glucose-Capillary: 156 mg/dL — ABNORMAL HIGH (ref 65–99)

## 2016-12-07 LAB — PROTIME-INR
INR: 1.42
Prothrombin Time: 17.4 seconds — ABNORMAL HIGH (ref 11.4–15.2)

## 2016-12-07 LAB — PREPARE RBC (CROSSMATCH)

## 2016-12-07 LAB — CDS SEROLOGY

## 2016-12-07 MED ORDER — OXYCODONE HCL 5 MG PO TABS: 5.0000 mg | ORAL_TABLET | Freq: Once | ORAL | Status: DC | PRN

## 2016-12-07 MED ORDER — QUETIAPINE FUMARATE 25 MG PO TABS
50.0000 mg | ORAL_TABLET | Freq: Three times a day (TID) | ORAL | Status: DC
  Administered 2016-12-07 – 2016-12-08 (×5): 50 mg
  Filled 2016-12-07 (×5): qty 2

## 2016-12-07 MED ORDER — FENTANYL 2500MCG IN NS 250ML (10MCG/ML) PREMIX INFUSION
25.0000 ug/h | INTRAVENOUS | Status: DC
  Administered 2016-12-07: 200 ug/h via INTRAVENOUS
  Administered 2016-12-07: 300 ug/h via INTRAVENOUS
  Administered 2016-12-08: 350 ug/h via INTRAVENOUS
  Filled 2016-12-07 (×4): qty 250

## 2016-12-07 MED ORDER — SODIUM CHLORIDE 0.9% FLUSH: 10.0000 mL | INTRAVENOUS | Status: DC | PRN

## 2016-12-07 MED ORDER — SODIUM CHLORIDE 0.9% FLUSH
10.0000 mL | Freq: Two times a day (BID) | INTRAVENOUS | Status: DC
  Administered 2016-12-07: 10 mL
  Administered 2016-12-08 – 2016-12-09 (×2): 20 mL
  Administered 2016-12-09: 10 mL

## 2016-12-07 MED ORDER — PROMETHAZINE HCL 25 MG/ML IJ SOLN: 6.2500 mg | INTRAMUSCULAR | Status: DC | PRN

## 2016-12-07 MED ORDER — PIVOT 1.5 CAL PO LIQD
1000.0000 mL | ORAL | Status: DC
  Administered 2016-12-07: 1000 mL

## 2016-12-07 MED ORDER — AMLODIPINE BESYLATE 5 MG PO TABS
5.0000 mg | ORAL_TABLET | Freq: Every day | ORAL | Status: DC
  Administered 2016-12-07 – 2016-12-08 (×2): 5 mg
  Filled 2016-12-07 (×2): qty 1

## 2016-12-07 MED ORDER — HEPARIN (PORCINE) IN NACL 100-0.45 UNIT/ML-% IJ SOLN
2300.0000 [IU]/h | INTRAMUSCULAR | Status: DC
  Administered 2016-12-07 (×2): 2300 [IU]/h via INTRAVENOUS
  Administered 2016-12-07: 1900 [IU]/h via INTRAVENOUS
  Administered 2016-12-08 – 2016-12-09 (×4): 2300 [IU]/h via INTRAVENOUS
  Filled 2016-12-07 (×6): qty 250

## 2016-12-07 MED ORDER — CEFAZOLIN SODIUM-DEXTROSE 2-4 GM/100ML-% IV SOLN
2.0000 g | Freq: Three times a day (TID) | INTRAVENOUS | Status: AC
  Administered 2016-12-07 – 2016-12-08 (×3): 2 g via INTRAVENOUS
  Filled 2016-12-07 (×3): qty 100

## 2016-12-07 MED ORDER — VITAL HIGH PROTEIN PO LIQD: 1000.0000 mL | ORAL | Status: DC

## 2016-12-07 MED ORDER — OXYCODONE HCL 5 MG/5ML PO SOLN: 5.0000 mg | Freq: Once | ORAL | Status: DC | PRN

## 2016-12-07 MED ORDER — FENTANYL CITRATE (PF) 100 MCG/2ML IJ SOLN
50.0000 ug | Freq: Once | INTRAMUSCULAR | Status: AC
  Administered 2016-12-07: 50 ug via INTRAVENOUS

## 2016-12-07 MED ORDER — QUETIAPINE FUMARATE 100 MG PO TABS: 100.0000 mg | ORAL_TABLET | Freq: Three times a day (TID) | ORAL | Status: DC

## 2016-12-07 MED ORDER — HYDROMORPHONE HCL 1 MG/ML IJ SOLN: 0.2500 mg | INTRAMUSCULAR | Status: DC | PRN

## 2016-12-07 MED ORDER — BACITRACIN-NEOMYCIN-POLYMYXIN OINTMENT TUBE
TOPICAL_OINTMENT | Freq: Every day | CUTANEOUS | Status: DC
  Administered 2016-12-07 – 2016-12-08 (×2): 1 via TOPICAL
  Administered 2016-12-09: 09:00:00 via TOPICAL
  Filled 2016-12-07 (×2): qty 1

## 2016-12-07 MED ORDER — FENTANYL BOLUS VIA INFUSION
50.0000 ug | INTRAVENOUS | Status: DC | PRN
  Filled 2016-12-07: qty 50

## 2016-12-07 MED ORDER — ADULT MULTIVITAMIN LIQUID CH
15.0000 mL | Freq: Every day | ORAL | Status: DC
  Administered 2016-12-07 – 2016-12-09 (×3): 15 mL
  Filled 2016-12-07 (×3): qty 15

## 2016-12-07 MED ORDER — ONDANSETRON HCL 4 MG/2ML IJ SOLN: 4.0000 mg | Freq: Once | INTRAMUSCULAR | Status: DC | PRN

## 2016-12-07 MED ORDER — CLONAZEPAM 0.5 MG PO TABS
0.5000 mg | ORAL_TABLET | Freq: Two times a day (BID) | ORAL | Status: DC
  Administered 2016-12-07 – 2016-12-08 (×3): 0.5 mg
  Filled 2016-12-07 (×3): qty 1

## 2016-12-07 MED ORDER — SODIUM CHLORIDE 0.9 % IV SOLN
Freq: Once | INTRAVENOUS | Status: AC
  Administered 2016-12-07: 22:00:00 via INTRAVENOUS
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
## 2016-12-07 NOTE — Progress Notes (Signed)
ABG was ordered for patient.  Patient was on CPAP/PSV of 5/8, FIO2 of 40%.  Results given to MD.  No further changes at this time.   Ref. Range 12/07/2016 08:43  Sample type Unknown ARTERIAL  pH, Arterial Latest Ref Range: 7.350 - 7.450  7.338 (L)  pCO2 arterial Latest Ref Range: 32.0 - 48.0 mmHg 47.6  pO2, Arterial Latest Ref Range: 83.0 - 108.0 mmHg 67.0 (L)  TCO2 Latest Ref Range: 0 - 100 mmol/L 27  Bicarbonate Latest Ref Range: 20.0 - 28.0 mmol/L 25.6  O2 Saturation Latest Units: % 92.0  Patient temperature Unknown 98.6 F  Collection site Unknown ARTERIAL LINE
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
## 2016-12-07 NOTE — Progress Notes (Signed)
CRITICAL VALUE ALERT  Critical value received:  Hgb 6.7  Date of notification:  12/07/16   Time of notification:  2020  Critical value read back: Yes  Nurse who received alert: Feliberto GottronBrad Paeton Latouche RN  MD notified (1st page): Cornett MD  Time of first page:  2052  MD notified (2nd page):  Time of second page:  Responding MD:  Luisa Hartornett MD  Time MD responded:  2053   Marlou PorchBradley Lovene Maret

## 2016-12-07 NOTE — Progress Notes (Signed)
Peripherally Inserted Central Catheter/Midline Placement  The IV Nurse has discussed with the patient and/or persons authorized to consent for the patient, the purpose of this procedure and the potential benefits and risks involved with this procedure.  The benefits include less needle sticks, lab draws from the catheter, and the patient may be discharged home with the catheter. Risks include, but not limited to, infection, bleeding, blood clot (thrombus formation), and puncture of an artery; nerve damage and irregular heartbeat and possibility to perform a PICC exchange if needed/ordered by physician.  Alternatives to this procedure were also discussed.  Bard Power PICC patient education guide, fact sheet on infection prevention and patient information card has been provided to patient /or left at bedside.    PICC/Midline Placement Documentation     Medically necessary per Dr.Wyatt   Franne GripNewman, Jerah Esty Renee 12/07/2016, 10:48 AM
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
## 2016-12-07 NOTE — Progress Notes (Signed)
ANTICOAGULATION CONSULT NOTE - Follow Up Consult  Pharmacy Consult for heparin Indication: pulmonary embolus  Labs:  Recent Labs  12/05/16 0345  12/05/16 1359 12/05/2016 0625 12/12/2016 1654 12/07/16 0407 12/07/16 0445 12/07/16 1529 12/07/16 1949 12/07/16 2319  HGB 11.3*  --   --  10.1* 7.8*  --  7.6*  --  6.7*  --   HCT 33.8*  --   --  29.8* 23.0*  --  22.2*  --  19.8*  --   PLT 206  --   --  193  --   --  159  --  172  --   APTT 39*  --   --   --   --   --  40*  --   --   --   LABPROT 23.2*  < > 28.8* 25.7*  --  17.4*  --   --   --   --   INR 2.02  < > 2.65 2.30  --  1.42  --   --   --   --   HEPARINUNFRC  --   --   --   --   --   --   --  <0.10*  --  0.43  CREATININE 0.86  --   --  0.97  --   --  1.00  --   --   --   < > = values in this interval not displayed.   Assessment/Plan:  51yo male therapeutic on heparin after rate change. Will continue gtt at current rate and confirm stable with am labs.   Taylor GamblesVeronda Gilberte Wise, PharmD, BCPS  12/07/2016,11:47 PM

## 2016-12-07 NOTE — Progress Notes (Signed)
Orthopedic Tech Progress Note Patient Details:  Taylor Wise 1966-05-04 [id number]  Patient ID: Taylor Wise, male   DOB: 1966-05-04, 51 y.o.   MRN: [id number]   Nikki DomCrawford, Marquette Blodgett 12/07/2016, 9:16 AM Called in advanced brace order; [facility]

## 2016-12-07 NOTE — Progress Notes (Signed)
OT Cancellation Note  Patient Details Name: Taylor Wise MRN: [id number] DOB: 04-17-1966   Cancelled Treatment:    Reason Eval/Treat Not Completed: Medical issues which prohibited therapy.  Pt intubated and sedated.  Will try back.  Odaly Peri [place], OTR/L [phone]   Jeani HawkingConarpe, Camilla Skeen M 12/07/2016, 3:05 PM
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
## 2016-12-07 NOTE — Progress Notes (Signed)
PT Cancellation Note  Patient Details Name: Taylor Wise MRN: [id number] DOB: 1966/03/13   Cancelled Treatment:    Reason Eval/Treat Not Completed: Patient not medically ready. RN deferred this date as pt intubated and on profolol and still very agitated. PT to return as able, when appropriate.   Sheikh Leverich M [place] Masaki 12/07/2016, 10:15 AM  Lewis ShockAshly Jonalyn Sedlak, PT, DPT Pager #: [phone] Office #: [phone]
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
## 2016-12-08 ENCOUNTER — Inpatient Hospital Stay (HOSPITAL_COMMUNITY): Payer: Medicaid Other

## 2016-12-08 LAB — POCT I-STAT 3, ART BLOOD GAS (G3+)
ACID-BASE DEFICIT: 2 mmol/L (ref 0.0–2.0)
ACID-BASE DEFICIT: 3 mmol/L — AB (ref 0.0–2.0)
Acid-base deficit: 2 mmol/L (ref 0.0–2.0)
Acid-base deficit: 3 mmol/L — ABNORMAL HIGH (ref 0.0–2.0)
BICARBONATE: 26.4 mmol/L (ref 20.0–28.0)
BICARBONATE: 24.3 mmol/L (ref 20.0–28.0)
BICARBONATE: 24 mmol/L (ref 20.0–28.0)
Bicarbonate: 24.3 mmol/L (ref 20.0–28.0)
Bicarbonate: 22.5 mmol/L (ref 20.0–28.0)
O2 SAT: 99 %
O2 Saturation: 91 %
O2 Saturation: 98 %
O2 Saturation: 82 %
O2 Saturation: 84 %
PCO2 ART: 49.4 mmHg — AB (ref 32.0–48.0)
PH ART: 7.297 — AB (ref 7.350–7.450)
PO2 ART: 68 mmHg — AB (ref 83.0–108.0)
Patient temperature: 99
Patient temperature: 99.9
TCO2: 28 mmol/L (ref 0–100)
TCO2: 26 mmol/L (ref 0–100)
TCO2: 25 mmol/L (ref 0–100)
TCO2: 26 mmol/L (ref 0–100)
TCO2: 24 mmol/L (ref 0–100)
pCO2 arterial: 54.5 mmHg — ABNORMAL HIGH (ref 32.0–48.0)
pCO2 arterial: 47.6 mmHg (ref 32.0–48.0)
pCO2 arterial: 58.4 mmHg — ABNORMAL HIGH (ref 32.0–48.0)
pCO2 arterial: 47.3 mmHg (ref 32.0–48.0)
pH, Arterial: 7.298 — ABNORMAL LOW (ref 7.350–7.450)
pH, Arterial: 7.302 — ABNORMAL LOW (ref 7.350–7.450)
pH, Arterial: 7.315 — ABNORMAL LOW (ref 7.350–7.450)
pH, Arterial: 7.24 — ABNORMAL LOW (ref 7.350–7.450)
pO2, Arterial: 170 mmHg — ABNORMAL HIGH (ref 83.0–108.0)
pO2, Arterial: 117 mmHg — ABNORMAL HIGH (ref 83.0–108.0)
pO2, Arterial: 64 mmHg — ABNORMAL LOW (ref 83.0–108.0)
pO2, Arterial: 62 mmHg — ABNORMAL LOW (ref 83.0–108.0)

## 2016-12-08 LAB — GLUCOSE, CAPILLARY
GLUCOSE-CAPILLARY: 123 mg/dL — AB (ref 65–99)
GLUCOSE-CAPILLARY: 137 mg/dL — AB (ref 65–99)
GLUCOSE-CAPILLARY: 149 mg/dL — AB (ref 65–99)
GLUCOSE-CAPILLARY: 178 mg/dL — AB (ref 65–99)
Glucose-Capillary: 172 mg/dL — ABNORMAL HIGH (ref 65–99)
Glucose-Capillary: 153 mg/dL — ABNORMAL HIGH (ref 65–99)
Glucose-Capillary: 103 mg/dL — ABNORMAL HIGH (ref 65–99)

## 2016-12-08 LAB — CBC
HCT: 22 % — ABNORMAL LOW (ref 39.0–52.0)
HEMOGLOBIN: 7.3 g/dL — AB (ref 13.0–17.0)
MCH: 29.8 pg (ref 26.0–34.0)
MCHC: 33.2 g/dL (ref 30.0–36.0)
MCV: 89.8 fL (ref 78.0–100.0)
Platelets: 151 10*3/uL (ref 150–400)
RBC: 2.45 MIL/uL — AB (ref 4.22–5.81)
RDW: 16.3 % — ABNORMAL HIGH (ref 11.5–15.5)
WBC: 1.6 10*3/uL — ABNORMAL LOW (ref 4.0–10.5)

## 2016-12-08 LAB — BASIC METABOLIC PANEL
Anion gap: 7 (ref 5–15)
BUN: 27 mg/dL — AB (ref 6–20)
CALCIUM: 7.5 mg/dL — AB (ref 8.9–10.3)
CO2: 24 mmol/L (ref 22–32)
CREATININE: 1.13 mg/dL (ref 0.61–1.24)
Chloride: 106 mmol/L (ref 101–111)
Glucose, Bld: 172 mg/dL — ABNORMAL HIGH (ref 65–99)
Potassium: 3.9 mmol/L (ref 3.5–5.1)
SODIUM: 137 mmol/L (ref 135–145)

## 2016-12-08 LAB — CBC WITH DIFFERENTIAL/PLATELET
BASOS PCT: 0 %
Basophils Absolute: 0 10*3/uL (ref 0.0–0.1)
EOS ABS: 0 10*3/uL (ref 0.0–0.7)
EOS PCT: 0 %
HCT: 23.9 % — ABNORMAL LOW (ref 39.0–52.0)
Hemoglobin: 8.2 g/dL — ABNORMAL LOW (ref 13.0–17.0)
LYMPHS ABS: 0.5 10*3/uL — AB (ref 0.7–4.0)
Lymphocytes Relative: 8 %
MCH: 30.6 pg (ref 26.0–34.0)
MCHC: 34.3 g/dL (ref 30.0–36.0)
MCV: 89.2 fL (ref 78.0–100.0)
Monocytes Absolute: 0.7 10*3/uL (ref 0.1–1.0)
Monocytes Relative: 11 %
Neutro Abs: 4.8 10*3/uL (ref 1.7–7.7)
Neutrophils Relative %: 81 %
PLATELETS: 174 10*3/uL (ref 150–400)
RBC: 2.68 MIL/uL — ABNORMAL LOW (ref 4.22–5.81)
RDW: 15.9 % — AB (ref 11.5–15.5)
WBC: 6 10*3/uL (ref 4.0–10.5)

## 2016-12-08 LAB — PROTIME-INR
INR: 1.36
PROTHROMBIN TIME: 16.9 s — AB (ref 11.4–15.2)

## 2016-12-08 LAB — HEPARIN LEVEL (UNFRACTIONATED): HEPARIN UNFRACTIONATED: 0.48 [IU]/mL (ref 0.30–0.70)

## 2016-12-08 LAB — PREPARE RBC (CROSSMATCH)

## 2016-12-08 MED ORDER — FENTANYL CITRATE (PF) 100 MCG/2ML IJ SOLN: 100.0000 ug | Freq: Once | INTRAMUSCULAR | Status: DC | PRN

## 2016-12-08 MED ORDER — FENTANYL 2500MCG IN NS 250ML (10MCG/ML) PREMIX INFUSION: 100.0000 ug/h | INTRAVENOUS | Status: DC

## 2016-12-08 MED ORDER — FENTANYL CITRATE (PF) 100 MCG/2ML IJ SOLN
100.0000 ug | Freq: Once | INTRAMUSCULAR | Status: AC
  Administered 2016-12-08: 100 ug via INTRAVENOUS

## 2016-12-08 MED ORDER — FUROSEMIDE 10 MG/ML IJ SOLN
INTRAMUSCULAR | Status: AC
  Filled 2016-12-08: qty 2

## 2016-12-08 MED ORDER — VECURONIUM BOLUS VIA INFUSION
10.0000 mg | Freq: Once | INTRAVENOUS | Status: DC
  Filled 2016-12-08: qty 10

## 2016-12-08 MED ORDER — VECURONIUM BROMIDE 10 MG IV SOLR
0.8000 ug/kg/min | INTRAVENOUS | Status: DC
  Administered 2016-12-08: 1 ug/kg/min via INTRAVENOUS
  Filled 2016-12-08: qty 100

## 2016-12-08 MED ORDER — FENTANYL BOLUS VIA INFUSION
50.0000 ug | INTRAVENOUS | Status: DC | PRN
  Filled 2016-12-08: qty 50

## 2016-12-08 MED ORDER — SODIUM CHLORIDE 0.9 % IV SOLN: Freq: Once | INTRAVENOUS | Status: DC

## 2016-12-08 MED ORDER — ALBUMIN HUMAN 5 % IV SOLN
25.0000 g | Freq: Once | INTRAVENOUS | Status: AC
  Administered 2016-12-08: 25 g via INTRAVENOUS
  Filled 2016-12-08: qty 250

## 2016-12-08 MED ORDER — SODIUM CHLORIDE 0.9 % IV SOLN
1250.0000 mg | Freq: Two times a day (BID) | INTRAVENOUS | Status: DC
  Administered 2016-12-08 – 2016-12-09 (×3): 1250 mg via INTRAVENOUS
  Filled 2016-12-08 (×4): qty 1250

## 2016-12-08 MED ORDER — FUROSEMIDE 10 MG/ML IJ SOLN
20.0000 mg | Freq: Once | INTRAMUSCULAR | Status: AC
  Administered 2016-12-08: 20 mg via INTRAVENOUS

## 2016-12-08 MED ORDER — ALBUMIN HUMAN 5 % IV SOLN
INTRAVENOUS | Status: AC
  Filled 2016-12-08: qty 250

## 2016-12-08 MED ORDER — ACETAMINOPHEN 160 MG/5ML PO SOLN
650.0000 mg | Freq: Four times a day (QID) | ORAL | Status: DC | PRN
  Administered 2016-12-08 – 2016-12-09 (×3): 650 mg via ORAL
  Filled 2016-12-08 (×3): qty 20.3

## 2016-12-08 MED ORDER — INSULIN ASPART 100 UNIT/ML ~~LOC~~ SOLN
0.0000 [IU] | SUBCUTANEOUS | Status: DC
  Administered 2016-12-08 (×2): 2 [IU] via SUBCUTANEOUS
  Administered 2016-12-08: 3 [IU] via SUBCUTANEOUS
  Administered 2016-12-09: 2 [IU] via SUBCUTANEOUS
  Administered 2016-12-09: 3 [IU] via SUBCUTANEOUS

## 2016-12-08 MED ORDER — CHLORHEXIDINE GLUCONATE 0.12% ORAL RINSE (MEDLINE KIT)
15.0000 mL | Freq: Two times a day (BID) | OROMUCOSAL | Status: DC
  Administered 2016-12-08 – 2016-12-09 (×3): 15 mL via OROMUCOSAL

## 2016-12-08 MED ORDER — VECURONIUM BROMIDE 10 MG IV SOLR
0.8000 ug/kg/min | INTRAVENOUS | Status: DC
  Filled 2016-12-08: qty 100

## 2016-12-08 MED ORDER — FENTANYL BOLUS VIA INFUSION: 50.0000 ug | INTRAVENOUS | Status: DC | PRN

## 2016-12-08 MED ORDER — NOREPINEPHRINE BITARTRATE 1 MG/ML IV SOLN
0.0000 ug/min | INTRAVENOUS | Status: DC
  Administered 2016-12-08: 4 ug/min via INTRAVENOUS
  Filled 2016-12-08 (×2): qty 4

## 2016-12-08 MED ORDER — PIPERACILLIN-TAZOBACTAM 3.375 G IVPB
3.3750 g | Freq: Three times a day (TID) | INTRAVENOUS | Status: DC
  Administered 2016-12-08 – 2016-12-09 (×5): 3.375 g via INTRAVENOUS
  Filled 2016-12-08 (×6): qty 50

## 2016-12-08 MED ORDER — VECURONIUM BOLUS VIA INFUSION
0.0800 mg/kg | Freq: Once | INTRAVENOUS | Status: DC
  Filled 2016-12-08: qty 10

## 2016-12-08 MED ORDER — FENTANYL 2500MCG IN NS 250ML (10MCG/ML) PREMIX INFUSION
100.0000 ug/h | INTRAVENOUS | Status: DC
  Filled 2016-12-08: qty 250

## 2016-12-08 MED ORDER — PROPOFOL 1000 MG/100ML IV EMUL
25.0000 ug/kg/min | INTRAVENOUS | Status: DC
  Administered 2016-12-08 (×3): 60 ug/kg/min via INTRAVENOUS
  Filled 2016-12-08 (×3): qty 200

## 2016-12-08 MED ORDER — VECURONIUM BROMIDE 10 MG IV SOLR
INTRAVENOUS | Status: AC
  Administered 2016-12-08: 10 mg
  Filled 2016-12-08: qty 10

## 2016-12-08 MED ORDER — VECURONIUM BROMIDE 10 MG IV SOLR
0.1000 mg/kg/h | INTRAVENOUS | Status: DC
  Filled 2016-12-08: qty 50

## 2016-12-08 MED ORDER — SODIUM CHLORIDE 0.9 % IV SOLN
Freq: Once | INTRAVENOUS | Status: AC
  Administered 2016-12-08: via INTRAVENOUS

## 2016-12-08 MED ORDER — ORAL CARE MOUTH RINSE
15.0000 mL | OROMUCOSAL | Status: DC
  Administered 2016-12-08 – 2016-12-09 (×12): 15 mL via OROMUCOSAL

## 2016-12-08 MED ORDER — ARTIFICIAL TEARS OP OINT
1.0000 "application " | TOPICAL_OINTMENT | Freq: Three times a day (TID) | OPHTHALMIC | Status: DC
  Administered 2016-12-08 – 2016-12-09 (×5): 1 via OPHTHALMIC
  Filled 2016-12-08 (×2): qty 3.5

## 2016-12-08 NOTE — Progress Notes (Signed)
vt changed back to 8cc's and rate change to match pt. Minute ventilation per Dr. Delton CoombesByrum.

## 2016-12-08 NOTE — Progress Notes (Signed)
Sputum sample obtained and sent down to main lab without complications.  

## 2016-12-08 NOTE — Progress Notes (Signed)
Entered patient room and he was in full body rigor temp checked and was 99.9 F HR 120's and SBP 200's; fentanyl increased to 400 and propofol to 80mcg; RT called to bedside and Dr. Lindie SpruceWyatt notified and will come to bedside. At 1430 temp was 103.3 axillary. Will continue to monitor
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
## 2016-12-08 NOTE — Progress Notes (Signed)
Clinical Social Worker received phone call from BJ'sJenee Stanley [facility] to patients wife's condition. Lajean SilviusJenee stated that the patients wife is currently stable but still slightly confused and unaware of her surroundings. 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 CSW stated that patient will have a lengthy hospital stay. Lajean SilviusJenee stated if [facility] had any question to please contact her at [phone]  Marrianne MoodAshley Lula Michaux, MSW,  [place]oon[place]CSWA [phone]

## 2016-12-08 NOTE — Significant Event (Signed)
Patient having significat oxygen desaturations 78%-85%; increased oxygen to 100% with O2 sats increasing to 86%; Dr. Janee Mornhompson notified and coming to bedside; RT at bedside.       Aliene AltesFrahm, Jenissa Tyrell L

## 2016-12-08 NOTE — Progress Notes (Signed)
RT note: respiratory rate increased from 15 to 18 by MD for ABG results.

## 2016-12-08 NOTE — Progress Notes (Signed)
Patient ID: [name] JanRobby Florentino, male   DOB: [birth year], 51 y.o.   MRN: [id number] Desat. B BS. Increase PEEP to 10. Duoneb. Check repeat CXR now. Possible fat emboli. Violeta GelinasBurke Rhodesia Stanger, MD, MPH, FACS Trauma: [phone] General Surgery: [phone]

## 2016-12-08 NOTE — Progress Notes (Signed)
RT was called to patient room due to patient having a decrease in sats to 70s.  Increased FIO2 to 100%, sats came up to 88%.  MD was paged and came to bedside and increased PEEP from 8 to 10.  STAT chest xray ordered.  Gave PRN treatment.  Sats improved to 90%.  Will continue to monitor.
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
## 2016-12-08 NOTE — Progress Notes (Signed)
OT Cancellation Note and Discharge  Patient Details Name: [name] JanRobby Grose MRN: [id number] DOB: [birth year]   Cancelled Treatment:    Reason Eval/Treat Not Completed: Medical issues which prohibited therapy (pt remains on vent with FIO2 of 80% and medically not appropriate. Will sign off and await new order)  Evette GeorgesLeonard, Lashana Spang Eva [phone] 12/08/2016, 7:16 AM

## 2016-12-08 NOTE — Progress Notes (Signed)
Notified Dr. Lindie SpruceWyatt re: CVP 12 and Hgb 7.3; orders received for levophed gtt. Will cont to monitor
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
## 2016-12-08 NOTE — Progress Notes (Signed)
PT Cancellation Note/ Discharge  Patient Details Name: [name] JanRobby Macdougal MRN: [id number] DOB: 09-24-1966   Cancelled Treatment:    Reason Eval/Treat Not Completed: Medical issues which prohibited therapy (pt remains on vent with FIO2 of 80% and medically not appropriate. Will sign off and await new order)   Zane Samson B Katya Rolston 12/08/2016, 7:15 AM  Delaney MeigsMaija Tabor Makaya [place], PT [phone]

## 2016-12-08 NOTE — Progress Notes (Signed)
Patient desaturated with asynchrony on the ventilator.  Will chemically paralyze the patient for now until we can get his pulmonary dysfunction controlled.  [name], [name], Taylor Wise, Taylor Wise [phone] Trauma Surgeon
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
## 2016-12-08 NOTE — Progress Notes (Addendum)
Patient was started on ARDS protocol.  Respiratory rate was increased to 30 and PEEP increased to 15.  Decreased tidal volume to 7cc.

## 2016-12-09 DIAGNOSIS — J95821 Acute postprocedural respiratory failure: Secondary | ICD-10-CM

## 2016-12-09 DIAGNOSIS — J189 Pneumonia, unspecified organism: Secondary | ICD-10-CM

## 2016-12-09 DIAGNOSIS — J80 Acute respiratory distress syndrome: Secondary | ICD-10-CM

## 2016-12-09 DIAGNOSIS — N179 Acute kidney failure, unspecified: Secondary | ICD-10-CM

## 2016-12-09 DIAGNOSIS — J939 Pneumothorax, unspecified: Secondary | ICD-10-CM

## 2016-12-09 LAB — POCT I-STAT 3, ART BLOOD GAS (G3+)
ACID-BASE DEFICIT: 6 mmol/L — AB (ref 0.0–2.0)
ACID-BASE DEFICIT: 6 mmol/L — AB (ref 0.0–2.0)
ACID-BASE DEFICIT: 6 mmol/L — AB (ref 0.0–2.0)
Acid-base deficit: 6 mmol/L — ABNORMAL HIGH (ref 0.0–2.0)
Acid-base deficit: 7 mmol/L — ABNORMAL HIGH (ref 0.0–2.0)
Acid-base deficit: 6 mmol/L — ABNORMAL HIGH (ref 0.0–2.0)
Acid-base deficit: 4 mmol/L — ABNORMAL HIGH (ref 0.0–2.0)
BICARBONATE: 19.5 mmol/L — AB (ref 20.0–28.0)
BICARBONATE: 20 mmol/L (ref 20.0–28.0)
BICARBONATE: 20 mmol/L (ref 20.0–28.0)
BICARBONATE: 21.4 mmol/L (ref 20.0–28.0)
Bicarbonate: 19.9 mmol/L — ABNORMAL LOW (ref 20.0–28.0)
Bicarbonate: 20.3 mmol/L (ref 20.0–28.0)
Bicarbonate: 22.9 mmol/L (ref 20.0–28.0)
O2 SAT: 85 %
O2 SAT: 84 %
O2 Saturation: 78 %
O2 Saturation: 80 %
O2 Saturation: 80 %
O2 Saturation: 84 %
O2 Saturation: 87 %
PCO2 ART: 42.1 mmHg (ref 32.0–48.0)
PCO2 ART: 42.1 mmHg (ref 32.0–48.0)
PCO2 ART: 49.1 mmHg — AB (ref 32.0–48.0)
PCO2 ART: 49.5 mmHg — AB (ref 32.0–48.0)
PH ART: 7.269 — AB (ref 7.350–7.450)
PH ART: 7.278 — AB (ref 7.350–7.450)
PO2 ART: 49 mmHg — AB (ref 83.0–108.0)
PO2 ART: 51 mmHg — AB (ref 83.0–108.0)
PO2 ART: 60 mmHg — AB (ref 83.0–108.0)
PO2 ART: 60 mmHg — AB (ref 83.0–108.0)
Patient temperature: 98
Patient temperature: 98.9
Patient temperature: 99.5
TCO2: 21 mmol/L (ref 0–100)
TCO2: 21 mmol/L (ref 0–100)
TCO2: 21 mmol/L (ref 0–100)
TCO2: 21 mmol/L (ref 0–100)
TCO2: 22 mmol/L (ref 0–100)
TCO2: 23 mmol/L (ref 0–100)
TCO2: 24 mmol/L (ref 0–100)
pCO2 arterial: 42 mmHg (ref 32.0–48.0)
pCO2 arterial: 44.1 mmHg (ref 32.0–48.0)
pCO2 arterial: 42.6 mmHg (ref 32.0–48.0)
pH, Arterial: 7.245 — ABNORMAL LOW (ref 7.350–7.450)
pH, Arterial: 7.275 — ABNORMAL LOW (ref 7.350–7.450)
pH, Arterial: 7.288 — ABNORMAL LOW (ref 7.350–7.450)
pH, Arterial: 7.287 — ABNORMAL LOW (ref 7.350–7.450)
pH, Arterial: 7.291 — ABNORMAL LOW (ref 7.350–7.450)
pO2, Arterial: 51 mmHg — ABNORMAL LOW (ref 83.0–108.0)
pO2, Arterial: 61 mmHg — ABNORMAL LOW (ref 83.0–108.0)
pO2, Arterial: 59 mmHg — ABNORMAL LOW (ref 83.0–108.0)

## 2016-12-09 LAB — POCT I-STAT 4, (NA,K, GLUC, HGB,HCT)
Glucose, Bld: 106 mg/dL — ABNORMAL HIGH (ref 65–99)
HEMATOCRIT: 23 % — AB (ref 39.0–52.0)
HEMOGLOBIN: 7.8 g/dL — AB (ref 13.0–17.0)
POTASSIUM: 3.5 mmol/L (ref 3.5–5.1)
SODIUM: 142 mmol/L (ref 135–145)

## 2016-12-09 LAB — TYPE AND SCREEN
ABO/RH(D): A POS
ANTIBODY SCREEN: NEGATIVE
BLOOD PRODUCT EXPIRATION DATE: 201801112359
BLOOD PRODUCT EXPIRATION DATE: 201801112359
BLOOD PRODUCT EXPIRATION DATE: 201801112359
Blood Product Expiration Date: 201801112359
ISSUE DATE / TIME: 201801032115
ISSUE DATE / TIME: 201801040013
ISSUE DATE / TIME: 201801042109
UNIT TYPE AND RH: 6200
UNIT TYPE AND RH: 6200
UNIT TYPE AND RH: 6200
Unit Type and Rh: 6200

## 2016-12-09 LAB — BLOOD GAS, ARTERIAL
ACID-BASE EXCESS: 0.7 mmol/L (ref 0.0–2.0)
Bicarbonate: 26.5 mmol/L (ref 20.0–28.0)
DRAWN BY: 39899
FIO2: 60
O2 SAT: 98.6 %
PEEP/CPAP: 8 cmH2O
PH ART: 7.284 — AB (ref 7.350–7.450)
Patient temperature: 100
RATE: 18 resp/min
VT: 640 mL
pCO2 arterial: 58.2 mmHg — ABNORMAL HIGH (ref 32.0–48.0)
pO2, Arterial: 151 mmHg — ABNORMAL HIGH (ref 83.0–108.0)

## 2016-12-09 LAB — CBC
HEMATOCRIT: 23.6 % — AB (ref 39.0–52.0)
Hemoglobin: 7.9 g/dL — ABNORMAL LOW (ref 13.0–17.0)
MCH: 29.9 pg (ref 26.0–34.0)
MCHC: 33.5 g/dL (ref 30.0–36.0)
MCV: 89.4 fL (ref 78.0–100.0)
PLATELETS: 162 10*3/uL (ref 150–400)
RBC: 2.64 MIL/uL — ABNORMAL LOW (ref 4.22–5.81)
RDW: 15.9 % — AB (ref 11.5–15.5)
WBC: 4.3 10*3/uL (ref 4.0–10.5)

## 2016-12-09 LAB — BASIC METABOLIC PANEL
Anion gap: 9 (ref 5–15)
BUN: 28 mg/dL — AB (ref 6–20)
CALCIUM: 7.3 mg/dL — AB (ref 8.9–10.3)
CO2: 22 mmol/L (ref 22–32)
CREATININE: 1.34 mg/dL — AB (ref 0.61–1.24)
Chloride: 108 mmol/L (ref 101–111)
GFR calc Af Amer: >60 mL/min (ref >60)
GFR, EST NON AFRICAN AMERICAN: 59 mL/min — AB (ref >60)
Glucose, Bld: 169 mg/dL — ABNORMAL HIGH (ref 65–99)
POTASSIUM: 3.7 mmol/L (ref 3.5–5.1)
SODIUM: 139 mmol/L (ref 135–145)

## 2016-12-09 LAB — GLUCOSE, CAPILLARY
Glucose-Capillary: 154 mg/dL — ABNORMAL HIGH (ref 65–99)
Glucose-Capillary: 112 mg/dL — ABNORMAL HIGH (ref 65–99)
Glucose-Capillary: 101 mg/dL — ABNORMAL HIGH (ref 65–99)
Glucose-Capillary: 106 mg/dL — ABNORMAL HIGH (ref 65–99)
Glucose-Capillary: 109 mg/dL — ABNORMAL HIGH (ref 65–99)

## 2016-12-09 LAB — PROTIME-INR
INR: 1.66
PROTHROMBIN TIME: 19.8 s — AB (ref 11.4–15.2)

## 2016-12-09 LAB — URINE CULTURE: CULTURE: NO GROWTH

## 2016-12-09 LAB — HEPARIN LEVEL (UNFRACTIONATED): HEPARIN UNFRACTIONATED: 0.36 [IU]/mL (ref 0.30–0.70)

## 2016-12-09 MED ORDER — SODIUM CHLORIDE 0.9 % IV SOLN
2.0000 mg/h | INTRAVENOUS | Status: DC
  Administered 2016-12-09: 6 mg/h via INTRAVENOUS
  Administered 2016-12-09: 4 mg/h via INTRAVENOUS
  Administered 2016-12-09: 2 mg/h via INTRAVENOUS
  Filled 2016-12-09 (×4): qty 10

## 2016-12-09 MED ORDER — MIDAZOLAM HCL 2 MG/2ML IJ SOLN: 2.0000 mg | Freq: Once | INTRAMUSCULAR | Status: DC | PRN

## 2016-12-09 MED ORDER — ALBUMIN HUMAN 5 % IV SOLN
25.0000 g | Freq: Once | INTRAVENOUS | Status: AC
  Administered 2016-12-09: 25 g via INTRAVENOUS

## 2016-12-09 MED ORDER — NOREPINEPHRINE BITARTRATE 1 MG/ML IV SOLN
0.0000 ug/min | INTRAVENOUS | Status: DC
  Administered 2016-12-09: 10 ug/min via INTRAVENOUS
  Filled 2016-12-09 (×2): qty 16

## 2016-12-09 MED ORDER — SODIUM CHLORIDE 0.9 % IV SOLN: INTRAVENOUS | Status: DC

## 2016-12-09 MED ORDER — FENTANYL CITRATE (PF) 100 MCG/2ML IJ SOLN: 100.0000 ug | Freq: Once | INTRAMUSCULAR | Status: DC | PRN

## 2016-12-09 MED ORDER — MIDAZOLAM HCL 2 MG/2ML IJ SOLN
2.0000 mg | Freq: Once | INTRAMUSCULAR | Status: AC
  Administered 2016-12-09: 2 mg via INTRAVENOUS

## 2016-12-09 MED ORDER — ALBUMIN HUMAN 5 % IV SOLN
INTRAVENOUS | Status: AC
  Filled 2016-12-09: qty 500

## 2016-12-09 MED ORDER — ALBUMIN HUMAN 5 % IV SOLN
12.5000 g | Freq: Once | INTRAVENOUS | Status: AC
  Administered 2016-12-09: 12.5 g via INTRAVENOUS
  Filled 2016-12-09: qty 250

## 2016-12-09 MED ORDER — SODIUM CHLORIDE 0.9 % IV SOLN
3.0000 ug/kg/min | INTRAVENOUS | Status: DC
  Administered 2016-12-09 (×3): 3 ug/kg/min via INTRAVENOUS
  Filled 2016-12-09 (×4): qty 20

## 2016-12-09 MED ORDER — DOCUSATE SODIUM 50 MG/5ML PO LIQD
100.0000 mg | Freq: Two times a day (BID) | ORAL | Status: DC
  Administered 2016-12-09 (×2): 100 mg
  Filled 2016-12-09 (×2): qty 10

## 2016-12-09 MED ORDER — FENTANYL CITRATE (PF) 100 MCG/2ML IJ SOLN: 100.0000 ug | Freq: Once | INTRAMUSCULAR | Status: DC

## 2016-12-09 MED ORDER — FENTANYL BOLUS VIA INFUSION
50.0000 ug | INTRAVENOUS | Status: DC | PRN
  Filled 2016-12-09: qty 50

## 2016-12-09 MED ORDER — ARTIFICIAL TEARS OP OINT: 1.0000 "application " | TOPICAL_OINTMENT | Freq: Three times a day (TID) | OPHTHALMIC | Status: DC

## 2016-12-09 MED ORDER — MIDAZOLAM BOLUS VIA INFUSION
2.0000 mg | INTRAVENOUS | Status: DC | PRN
  Filled 2016-12-09: qty 2

## 2016-12-09 MED ORDER — FENTANYL 2500MCG IN NS 250ML (10MCG/ML) PREMIX INFUSION
100.0000 ug/h | INTRAVENOUS | Status: DC
  Administered 2016-12-09 (×3): 250 ug/h via INTRAVENOUS
  Administered 2016-12-10: 300 ug/h via INTRAVENOUS
  Filled 2016-12-09 (×4): qty 250

## 2016-12-09 MED ORDER — METOPROLOL TARTRATE 5 MG/5ML IV SOLN
5.0000 mg | Freq: Once | INTRAVENOUS | Status: AC
  Administered 2016-12-09: 5 mg via INTRAVENOUS

## 2016-12-09 MED ORDER — METOPROLOL TARTRATE 5 MG/5ML IV SOLN
INTRAVENOUS | Status: AC
  Filled 2016-12-09: qty 5
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
## 2016-12-09 NOTE — Progress Notes (Addendum)
Patient ID: Taylor Wise, male   DOB: 10-18-1966, 51 y.o.   MRN: [id number] I called Hester's father in [place] and updated him on his current status and critical condition with ARDS. His father reports Chang's wife has dementia and needs total care. He also let me know Ziquan's mother lives [place]. I will try to call her later this AM due to the time zone difference.  Violeta GelinasBurke Ikhlas Albo, MD, MPH, FACS Trauma: [phone] General Surgery: [phone]
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
## 2016-12-09 NOTE — Progress Notes (Signed)
Patient ID: [name] JanRobby Rhine, male   DOB: November 14, 1966, 51 y.o.   MRN: [id number] I called Ajahni's mother and updated her on his current critical condition with ARDS, etc. She lives in [place]. I answered her questions. She did report that Rohail's wife was placed in a SNF so she is being cared for.  Violeta GelinasBurke Scarlet Abad, MD, MPH, FACS Trauma: [phone] General Surgery: [phone]

## 2016-12-10 ENCOUNTER — Inpatient Hospital Stay (HOSPITAL_COMMUNITY): Payer: Medicaid Other

## 2016-12-10 DIAGNOSIS — J9601 Acute respiratory failure with hypoxia: Secondary | ICD-10-CM

## 2016-12-10 LAB — BLOOD CULTURE ID PANEL (REFLEXED)
ACINETOBACTER BAUMANNII: NOT DETECTED
CANDIDA ALBICANS: NOT DETECTED
CANDIDA GLABRATA: NOT DETECTED
CANDIDA KRUSEI: NOT DETECTED
CANDIDA TROPICALIS: NOT DETECTED
Candida parapsilosis: NOT DETECTED
Carbapenem resistance: NOT DETECTED
ENTEROBACTER CLOACAE COMPLEX: NOT DETECTED
ESCHERICHIA COLI: NOT DETECTED
Enterobacteriaceae species: NOT DETECTED
Enterococcus species: NOT DETECTED
HAEMOPHILUS INFLUENZAE: NOT DETECTED
KLEBSIELLA OXYTOCA: NOT DETECTED
KLEBSIELLA PNEUMONIAE: NOT DETECTED
Listeria monocytogenes: NOT DETECTED
Neisseria meningitidis: NOT DETECTED
PROTEUS SPECIES: NOT DETECTED
Pseudomonas aeruginosa: DETECTED — AB
STREPTOCOCCUS PYOGENES: NOT DETECTED
Serratia marcescens: NOT DETECTED
Staphylococcus aureus (BCID): NOT DETECTED
Staphylococcus species: NOT DETECTED
Streptococcus agalactiae: NOT DETECTED
Streptococcus pneumoniae: NOT DETECTED
Streptococcus species: NOT DETECTED

## 2016-12-10 LAB — CULTURE, RESPIRATORY

## 2016-12-10 LAB — POCT I-STAT 3, ART BLOOD GAS (G3+)
Acid-base deficit: 3 mmol/L — ABNORMAL HIGH (ref 0.0–2.0)
Acid-base deficit: 4 mmol/L — ABNORMAL HIGH (ref 0.0–2.0)
Bicarbonate: 23.4 mmol/L (ref 20.0–28.0)
Bicarbonate: 23.9 mmol/L (ref 20.0–28.0)
O2 SAT: 79 %
O2 Saturation: 51 %
PCO2 ART: 49.8 mmHg — AB (ref 32.0–48.0)
PO2 ART: 37 mmHg — AB (ref 83.0–108.0)
Patient temperature: 100.7
TCO2: 25 mmol/L (ref 0–100)
TCO2: 26 mmol/L (ref 0–100)
pCO2 arterial: 63.8 mmHg — ABNORMAL HIGH (ref 32.0–48.0)
pH, Arterial: 7.187 — CL (ref 7.350–7.450)
pH, Arterial: 7.285 — ABNORMAL LOW (ref 7.350–7.450)
pO2, Arterial: 52 mmHg — ABNORMAL LOW (ref 83.0–108.0)

## 2016-12-10 LAB — CULTURE, RESPIRATORY W GRAM STAIN: Special Requests: NORMAL

## 2016-12-10 MED ORDER — IPRATROPIUM-ALBUTEROL 0.5-2.5 (3) MG/3ML IN SOLN: 3.0000 mL | RESPIRATORY_TRACT | Status: DC

## 2016-12-10 MED ORDER — BUDESONIDE 0.5 MG/2ML IN SUSP: 0.5000 mg | Freq: Two times a day (BID) | RESPIRATORY_TRACT | Status: DC

## 2016-12-10 MED ORDER — ARFORMOTEROL TARTRATE 15 MCG/2ML IN NEBU: 15.0000 ug | INHALATION_SOLUTION | Freq: Two times a day (BID) | RESPIRATORY_TRACT | Status: DC

## 2016-12-10 MED ORDER — AMIODARONE HCL IN DEXTROSE 360-4.14 MG/200ML-% IV SOLN
INTRAVENOUS | Status: AC
  Filled 2016-12-10: qty 200

## 2016-12-10 MED ORDER — ADENOSINE 6 MG/2ML IV SOLN
INTRAVENOUS | Status: AC
  Filled 2016-12-10: qty 2

## 2016-12-10 MED ORDER — ACETYLCYSTEINE 20 % IN SOLN: 2.0000 mL | Freq: Three times a day (TID) | RESPIRATORY_TRACT | Status: DC

## 2016-12-11 ENCOUNTER — Encounter: Payer: Self-pay | Admitting: Allergy and Immunology

## 2016-12-12 LAB — CULTURE, BLOOD (ROUTINE X 2)

## 2016-12-13 LAB — CULTURE, BLOOD (ROUTINE X 2): Culture: NO GROWTH

## 2017-01-05 NOTE — Procedures (Signed)
Bronchoscopy Procedure Note Taylor Wise [id number] 11/15/1966  Procedure: Bronchoscopy Indications: Diagnostic evaluation of the airways and Remove secretions  Procedure Details Consent: Unable to obtain consent because of emergent medical necessity. Time Out: Verified patient identification, verified procedure, site/side was marked, verified correct patient position, special equipment/implants available, medications/allergies/relevent history reviewed, required imaging and test results available.  Performed  In preparation for procedure, patient was given 100% FiO2 and bronchoscope lubricated. Sedation: was already in place, including paralytics  Airway entered and the following bronchi were examined: RUL, RML, RLL, LUL and LLL.   Procedures performed: Brushings performed Bronchoscope removed.  , Patient placed back on 100% FiO2 at conclusion of procedure.    Evaluation Hemodynamic Status: Hypotensive, developed SVT during procedure; O2 sats: were persistently and profoundly low throughout procedure and did not appreciably change with suctining out secretions Patient's Current Condition: unstable Specimens:  None Complications: Complications of SVT and persistent hypoxemia Patient did not tolerate procedure well.   Taylor Wise, Taylor Wise 01/03/2017

## 2017-01-05 NOTE — Progress Notes (Addendum)
RN spoke with mother and father in regards to patients passing. Per fathers request, he would like to donate patients body to science. RN gave fathers wife three numbers for potential facilities for donation. The facilities with numbers are as follows:  [employer] [facility] [id number] [facility] [phone] [facility] [phone]  RN also gave fathers wife the phone number for [facility]. They were appreciative of information saying that this would be the second option if body donation was denied. RN gave wife [employer] phone number and instructed them to call when they have decided on where to send the pts body.   RN also asked fathers wife if giving information to patients stepson would be consented, and consent was given. RN called Lonna DuvalChris Gladden [phone]) to inform him of patients passing. Son did not ask any additional information other than confirmation that his stepfather had passed.
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
## 2017-01-05 NOTE — Progress Notes (Signed)
eLink Physician-Brief Progress Note Patient Name: Taylor Wise DOB: December 30, 1965 MRN: [id number]   Date of Service  12/25/2016  HPI/Events of Note  RN calls to give an update on pt. Pt with ARDS, on 100% Fio2, PEEP 15.  Sedated, intubated, paralyzed. Levophed has been weaned off 120/60, 120, 24, 83-85% O2 sats. diurescing  ABG 7.28, 50, 59  eICU Interventions  No change for now.  Keep pO2 > 55 mm Hg     Intervention Category Intermediate Interventions: Other:  Daneen SchickJose Angelo A De Dios 01/01/2017, 12:31 AM

## 2017-01-05 NOTE — Progress Notes (Signed)
Called by [facility] to evaluate patient with refractory hypoxemia. CXR revealed increased opactities of the RML, and B lower lobes. Suctioning with lavage did seem to improve stats from mid-60s, and purulent secretions were produced. Urgent bronchoscopy was performed to evaluate and potentially treat mucus plugging. Although moderate secretions were encountered and removed, this did not have desired effect on hypoxia. During bronch, pt went into SVT, which did not break with adenosine x2. U/S was performed to eval for shunt, which was difficult to evaluate for due to severe tachycardia (HR ~200). Bubbles did pass quickly, although shunt physiology could not be ruled in or ruled out due to technical challenges. The patient did seem to respond better to less PEEP. After discussing with trauma MD, no further meaningful interventions seemed available. He spoke with family and agreed to continued to supportive care for as long as possible, but realistically explained patient was likely going to die soon.  CRITICAL CARE Performed by: Jamie KatoRIMBLE, Gladyce Mcray   Total critical care time: 60 minutes  Critical care time was exclusive of separately billable procedures and treating other patients.  Critical care was necessary to treat or prevent imminent or life-threatening deterioration.  Critical care was time spent personally by me on the following activities: development of treatment plan with patient and/or surrogate as well as nursing, discussions with consultants, evaluation of patient's response to treatment, examination of patient, obtaining history from patient or surrogate, ordering and performing treatments and interventions, ordering and review of laboratory studies, ordering and review of radiographic studies, pulse oximetry and re-evaluation of patient's condition.

## 2017-01-05 NOTE — Progress Notes (Signed)
Dr. Janee Mornhompson notified of patients increased HR in the 120-130s with frequent PVCs. ISTAT drawn to reveal a potassium of 3.5. No magnesium has been drawn on patient during admission. This information passed along to MD. MD ordered 5mg  IV Lopressor. Will administer and continue to monitor.

## 2017-01-05 NOTE — Progress Notes (Signed)
Patient ID: [name] JanRobby Bolla, male   DOB: 07/12/1966, 51 y.o.   MRN: [id number] Hypoxic resp failure has worsened significantly. We tried changing position and that did not help. Dr. Christene Slatese Dios from [facility] is assisting and we are trying multiple vent changes without improvement. Sats remain 60%. Recruitment maneuver caused sats in the 40s. Will try prone position.   Violeta GelinasBurke Shavanna Furnari, MD, MPH, FACS Trauma: [phone] General Surgery: [phone]

## 2017-01-05 NOTE — Progress Notes (Signed)
eLink Physician-Brief Progress Note Patient Name: Taylor Wise DOB: [birth year] MRN: [id number]   Date of Service  12/29/2016  HPI/Events of Note  Pt's O2 saturation continued to decrease despite maximal efforts and despite being on 100% Fio2 PEEP 15.  Pt went into sinus bradycardia and asystole.   Chest compression was done.   Patient pronounced dead at 3: 14 am, 12/17/2016.  Parents have been notified by Dr. Janee Mornhompson that pt most likely will not survive tonight.   eICU Interventions  pls provide post mortem care.      Intervention Category Evaluation Type: Other  Jose Bridgette Habermannngelo A De Dios 12/20/2016, 3:17 AM

## 2017-01-05 NOTE — Progress Notes (Signed)
200mcg of 258300mcg/250cc Fentanyl, 40mg  of 1mg /ml Versed, 50cc of 200mg /200cc of Nimbex wasted in sink with Alyce PaganAllison Haggard, RN.

## 2017-01-05 NOTE — Progress Notes (Signed)
Patient ID: Taylor Wise, male   DOB: 1966-06-03, 51 y.o.   MRN: [id number] He remains in SVT due to hypoxia which has not improved despite maximal efforts. Appreciate [facility] assist including bronch at bedside. I called his mother and his father and let them both know it is unlikely Taylor Wise will survive this. His father asked that, if he does expire, that his body be donated for research after it is released by the ME.  Violeta GelinasBurke Camren Lipsett, MD, MPH, FACS Trauma: [phone] General Surgery: [phone]
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
## 2017-01-05 NOTE — Progress Notes (Signed)
eLink Physician-Brief Progress Note Patient Name: Taylor Wise DOB: 10/30/1966 MRN: [id number]   Date of Service  12/29/2016  HPI/Events of Note  RN calls for worsening hypoxemia.   O2 sats slowly dropped from 88% to 60% ABG with pO2 in the 30s.  CXR with likely RUL mucus plugging We tried ambubagging briefly > sats dropped to the 50s. We tried recruitment maneuver > sat dropped into the 50s again We tried PCV > sats stayed in the 60s and MV decreased.  We tried elevating R side up > no significant change.   Case discussed with Dr. Janee Mornhompson and Dr. Eugenia Pancoastrimble  Plan: 1. Will attempt to bronch as he is likely mucus plugging (worsened by paralytics) causing hypoxemia 2. Try proning pt as well.   eICU Interventions  Plan: 1. Will attempt to bronch as he is likely mucus plugging (worsened by paralytics) causing hypoxemia 2. Try proning pt as well.  3. Start mucomyst q8 4. Inc duoneb to q4 5. Start pulmicort and brovana neb BID     Intervention Category Intermediate Interventions: Other:  Daneen SchickJose Angelo A De Dios 12/26/2016, 1:43 AM

## 2017-01-05 DEATH — deceased

## 2017-02-17 ENCOUNTER — Ambulatory Visit: Payer: Medicaid Other | Admitting: Allergy and Immunology

## 2018-08-09 IMAGING — CR DG CHEST 1V PORT
2 series · 2 of 2 positions shown · non-contrast
Comparison: 12/08/2016

CLINICAL DATA: Oxygen desaturation tonight

EXAM:
PORTABLE CHEST 1 VIEW

[AP (1 of 2)]
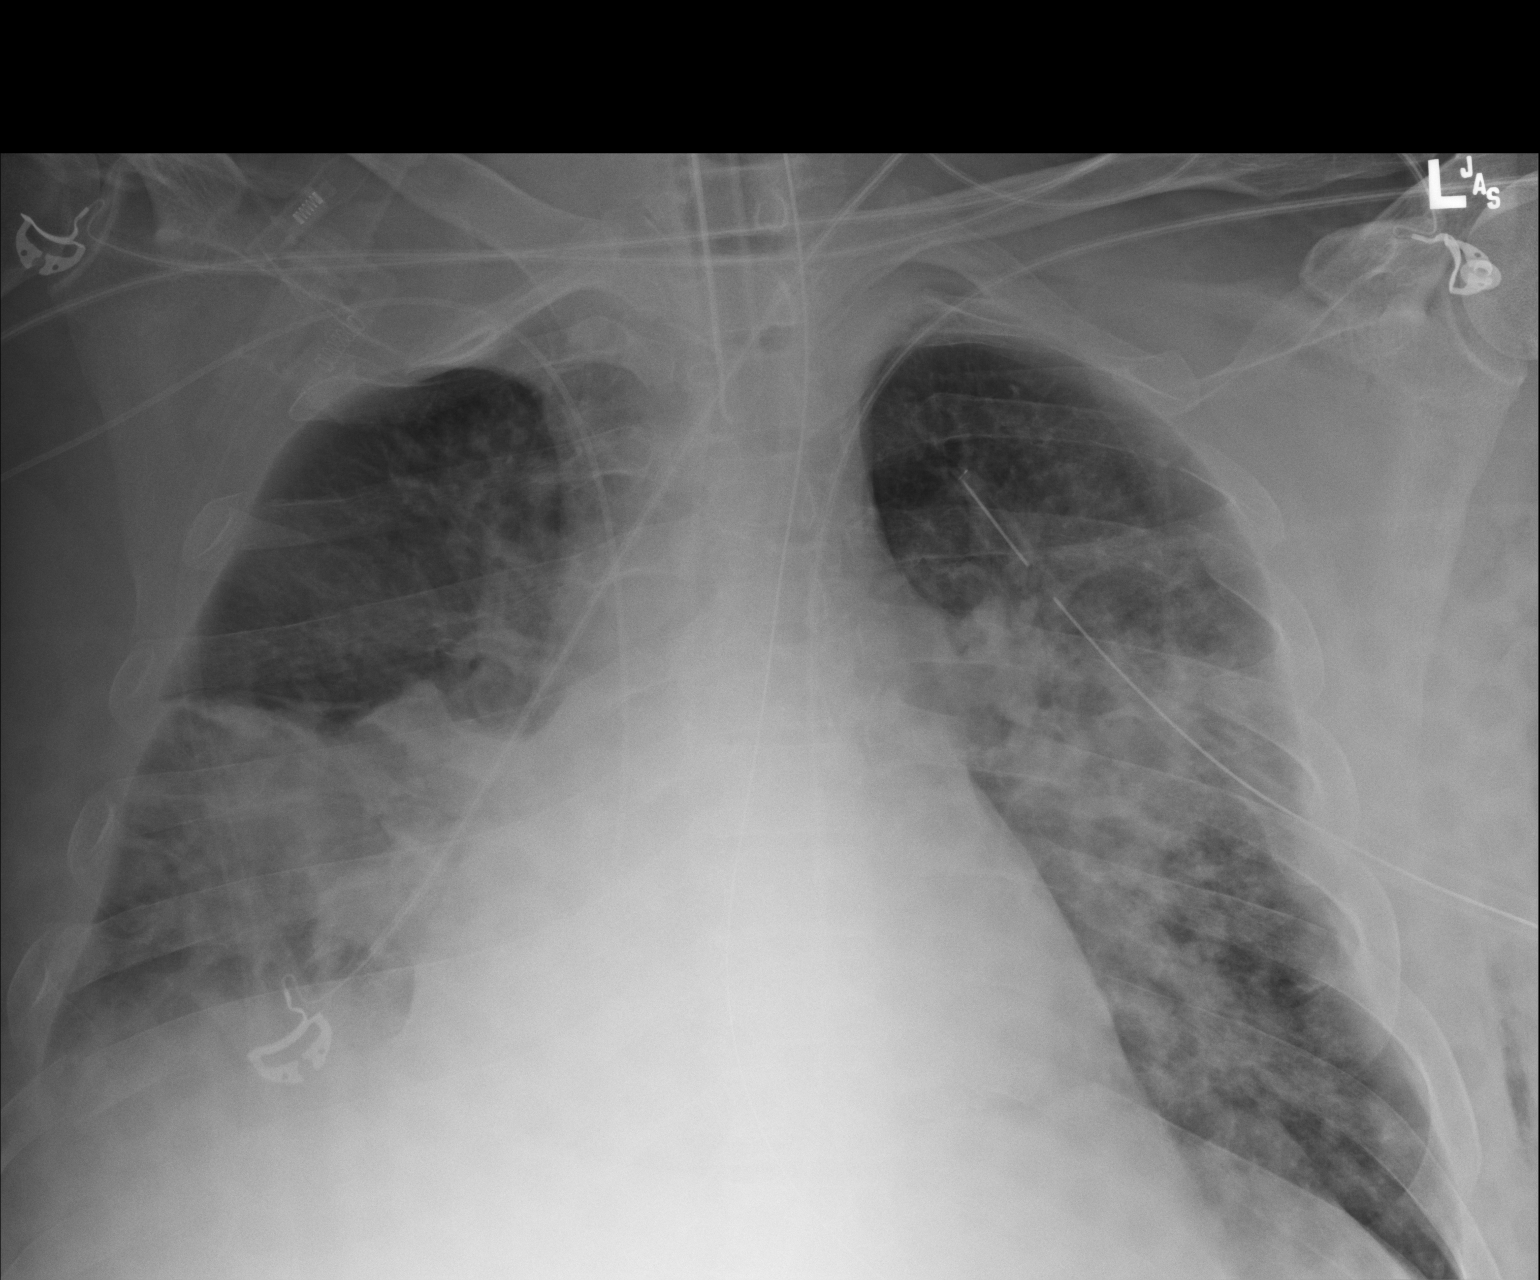

[AP (2 of 2)]
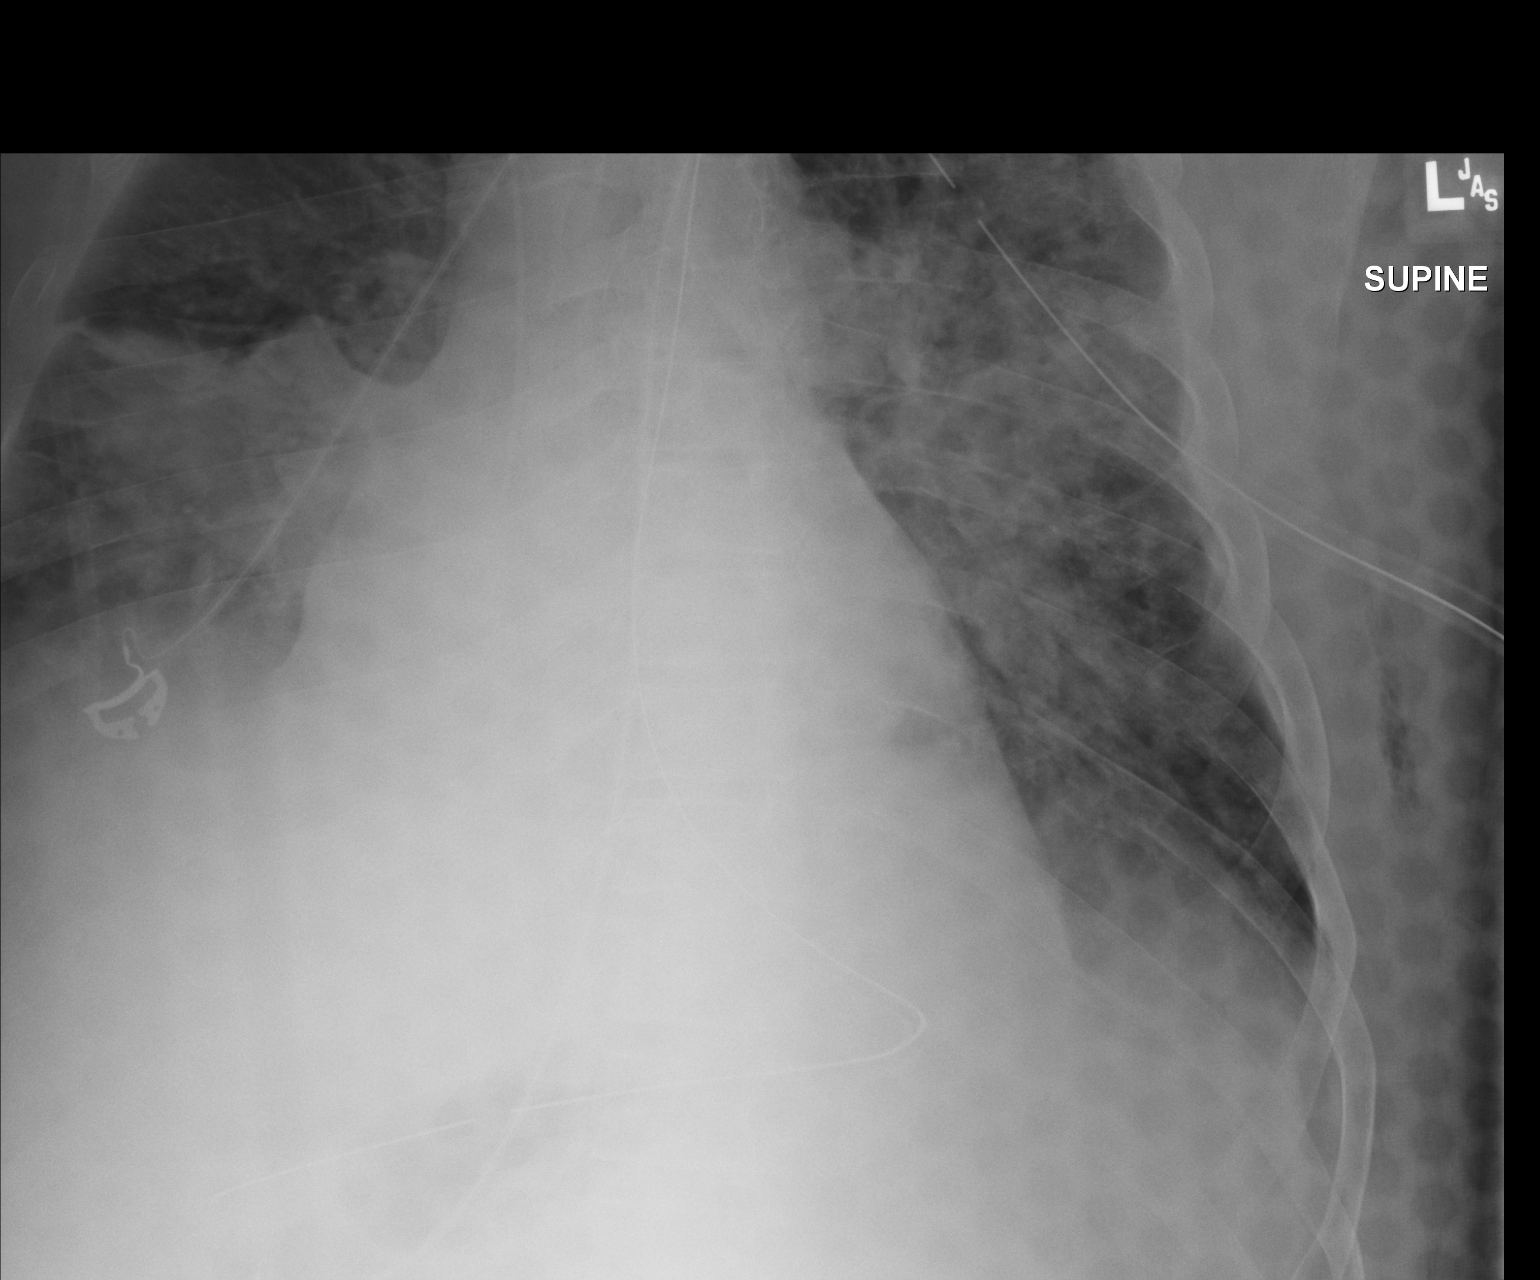

[2 of 2 positions shown; findings below may reference images not displayed]

FINDINGS: Endotracheal tube is 5.3 cm above the carina. Nasogastric tube
extends below the diaphragm and beyond the inferior edge of the
image. Left chest tube is unchanged in position. Right upper
extremity PICC line extends to the cavoatrial junction.

No pneumothorax.

Mild worsening of bilateral central airspace opacities, except for
the extreme left base which demonstrates some interval clearing.
IMPRESSION: Support equipment appears satisfactorily positioned. Worsening
central airspace opacities bilaterally. Some interval clearing of
the left lateral base.
